# Patient Record
Sex: Male | Born: 1980 | Hispanic: Yes | Marital: Single | Smoking: Never smoker
Health system: Southern US, Community
[De-identification: ages and names within clinical notes are randomized; demographics above are authoritative.]

## PROBLEM LIST (undated history)

## (undated) DIAGNOSIS — Z87442 Personal history of urinary calculi: Secondary | ICD-10-CM

## (undated) DIAGNOSIS — K429 Umbilical hernia without obstruction or gangrene: Secondary | ICD-10-CM

## (undated) HISTORY — PX: NO PAST SURGERIES: SHX2092

---

## 2007-02-20 ENCOUNTER — Ambulatory Visit: Payer: Self-pay | Admitting: Urology

## 2012-07-09 ENCOUNTER — Emergency Department: Payer: Self-pay | Admitting: Emergency Medicine

## 2012-07-09 LAB — CBC WITH DIFFERENTIAL/PLATELET
Basophil #: 0.1 10*3/uL (ref 0.0–0.1)
Basophil %: 0.9 %
Eosinophil #: 0.1 10*3/uL (ref 0.0–0.7)
Eosinophil %: 0.6 %
HGB: 17.4 g/dL (ref 13.0–18.0)
Lymphocyte %: 22.9 %
MCHC: 34.9 g/dL (ref 32.0–36.0)
Monocyte %: 7.6 %
Neutrophil #: 7 10*3/uL — ABNORMAL HIGH (ref 1.4–6.5)
Neutrophil %: 68 %
RBC: 5.55 10*6/uL (ref 4.40–5.90)
RDW: 12.6 % (ref 11.5–14.5)
WBC: 10.3 10*3/uL (ref 3.8–10.6)

## 2012-07-09 LAB — COMPREHENSIVE METABOLIC PANEL
Albumin: 4.4 g/dL (ref 3.4–5.0)
Alkaline Phosphatase: 104 U/L (ref 50–136)
Bilirubin,Total: 1.6 mg/dL — ABNORMAL HIGH (ref 0.2–1.0)
Chloride: 103 mmol/L (ref 98–107)
Creatinine: 0.87 mg/dL (ref 0.60–1.30)
EGFR (African American): >60
Osmolality: 277 (ref 275–301)
SGPT (ALT): 34 U/L (ref 12–78)
Sodium: 138 mmol/L (ref 136–145)
Total Protein: 8.1 g/dL (ref 6.4–8.2)

## 2012-07-09 LAB — TROPONIN I: Troponin-I: <0.02 ng/mL

## 2012-07-09 LAB — TSH: Thyroid Stimulating Horm: 2.96 u[IU]/mL

## 2012-07-12 ENCOUNTER — Emergency Department: Payer: Self-pay | Admitting: *Deleted

## 2012-07-12 LAB — CBC
MCH: 30.8 pg (ref 26.0–34.0)
MCHC: 35.1 g/dL (ref 32.0–36.0)
MCV: 88 fL (ref 80–100)
Platelet: 402 10*3/uL (ref 150–440)
RBC: 5.56 10*6/uL (ref 4.40–5.90)
RDW: 12.5 % (ref 11.5–14.5)
WBC: 11.4 10*3/uL — ABNORMAL HIGH (ref 3.8–10.6)

## 2012-07-12 LAB — COMPREHENSIVE METABOLIC PANEL
Anion Gap: 12 (ref 7–16)
BUN: 18 mg/dL (ref 7–18)
Bilirubin,Total: 2.8 mg/dL — ABNORMAL HIGH (ref 0.2–1.0)
Calcium, Total: 9.1 mg/dL (ref 8.5–10.1)
Chloride: 96 mmol/L — ABNORMAL LOW (ref 98–107)
Creatinine: 0.98 mg/dL (ref 0.60–1.30)
EGFR (African American): >60
Osmolality: 267 (ref 275–301)
Potassium: 3.4 mmol/L — ABNORMAL LOW (ref 3.5–5.1)
Total Protein: 8 g/dL (ref 6.4–8.2)

## 2012-07-14 ENCOUNTER — Emergency Department: Payer: Self-pay | Admitting: Emergency Medicine

## 2012-07-14 LAB — COMPREHENSIVE METABOLIC PANEL
Albumin: 4.1 g/dL (ref 3.4–5.0)
Alkaline Phosphatase: 93 U/L (ref 50–136)
BUN: 18 mg/dL (ref 7–18)
Bilirubin,Total: 2.9 mg/dL — ABNORMAL HIGH (ref 0.2–1.0)
Calcium, Total: 8.7 mg/dL (ref 8.5–10.1)
Creatinine: 1.04 mg/dL (ref 0.60–1.30)
EGFR (Non-African Amer.): >60
Glucose: 97 mg/dL (ref 65–99)
Potassium: 3.7 mmol/L (ref 3.5–5.1)
SGOT(AST): 23 U/L (ref 15–37)
SGPT (ALT): 29 U/L (ref 12–78)
Total Protein: 7.7 g/dL (ref 6.4–8.2)

## 2012-07-14 LAB — CBC WITH DIFFERENTIAL/PLATELET
Basophil %: 0.5 %
Eosinophil #: 0 10*3/uL (ref 0.0–0.7)
Eosinophil %: 0.3 %
HGB: 16.7 g/dL (ref 13.0–18.0)
Lymphocyte %: 19.7 %
MCH: 30.7 pg (ref 26.0–34.0)
MCHC: 35 g/dL (ref 32.0–36.0)
Monocyte #: 0.7 x10 3/mm (ref 0.2–1.0)
Neutrophil #: 7.6 10*3/uL — ABNORMAL HIGH (ref 1.4–6.5)
Platelet: 397 10*3/uL (ref 150–440)
WBC: 10.5 10*3/uL (ref 3.8–10.6)

## 2012-07-14 LAB — LIPASE, BLOOD: Lipase: 265 U/L (ref 73–393)

## 2012-09-13 ENCOUNTER — Emergency Department: Payer: Self-pay | Admitting: Emergency Medicine

## 2012-09-13 LAB — CBC
HGB: 16.9 g/dL (ref 13.0–18.0)
Platelet: 457 10*3/uL — ABNORMAL HIGH (ref 150–440)
RBC: 5.48 10*6/uL (ref 4.40–5.90)
WBC: 10.8 10*3/uL — ABNORMAL HIGH (ref 3.8–10.6)

## 2012-09-13 LAB — COMPREHENSIVE METABOLIC PANEL
Albumin: 4.3 g/dL (ref 3.4–5.0)
Alkaline Phosphatase: 97 U/L (ref 50–136)
Calcium, Total: 8.4 mg/dL — ABNORMAL LOW (ref 8.5–10.1)
Co2: 25 mmol/L (ref 21–32)
EGFR (Non-African Amer.): >60
Osmolality: 260 (ref 275–301)
SGOT(AST): 21 U/L (ref 15–37)
SGPT (ALT): 35 U/L (ref 12–78)

## 2012-09-13 LAB — URINALYSIS, COMPLETE
Bacteria: NONE SEEN
Bilirubin,UR: NEGATIVE
Ketone: NEGATIVE
Leukocyte Esterase: NEGATIVE
RBC,UR: 1 /HPF (ref 0–5)
Specific Gravity: 1.012 (ref 1.003–1.030)
Squamous Epithelial: NONE SEEN
WBC UR: 2 /HPF (ref 0–5)

## 2012-09-14 LAB — RAPID INFLUENZA A&B ANTIGENS

## 2013-09-28 IMAGING — CR DG CHEST 2V
1 series · 2 of 2 positions shown · non-contrast
Comparison: none

REASON FOR EXAM: epigastric pain
COMMENTS:

PROCEDURE:     DXR - DXR CHEST PA (OR AP) AND LATERAL  - July 12, 2012  [DATE]
RESULT:     The lungs are clear. The heart and pulmonary vessels are normal.
The bony and mediastinal structures are unremarkable. There is no effusion.
There is no pneumothorax or evidence of congestive failure.

[Series 1: pa · 0.17mm/px · 2 of 2 slices shown]
[im 1/2]
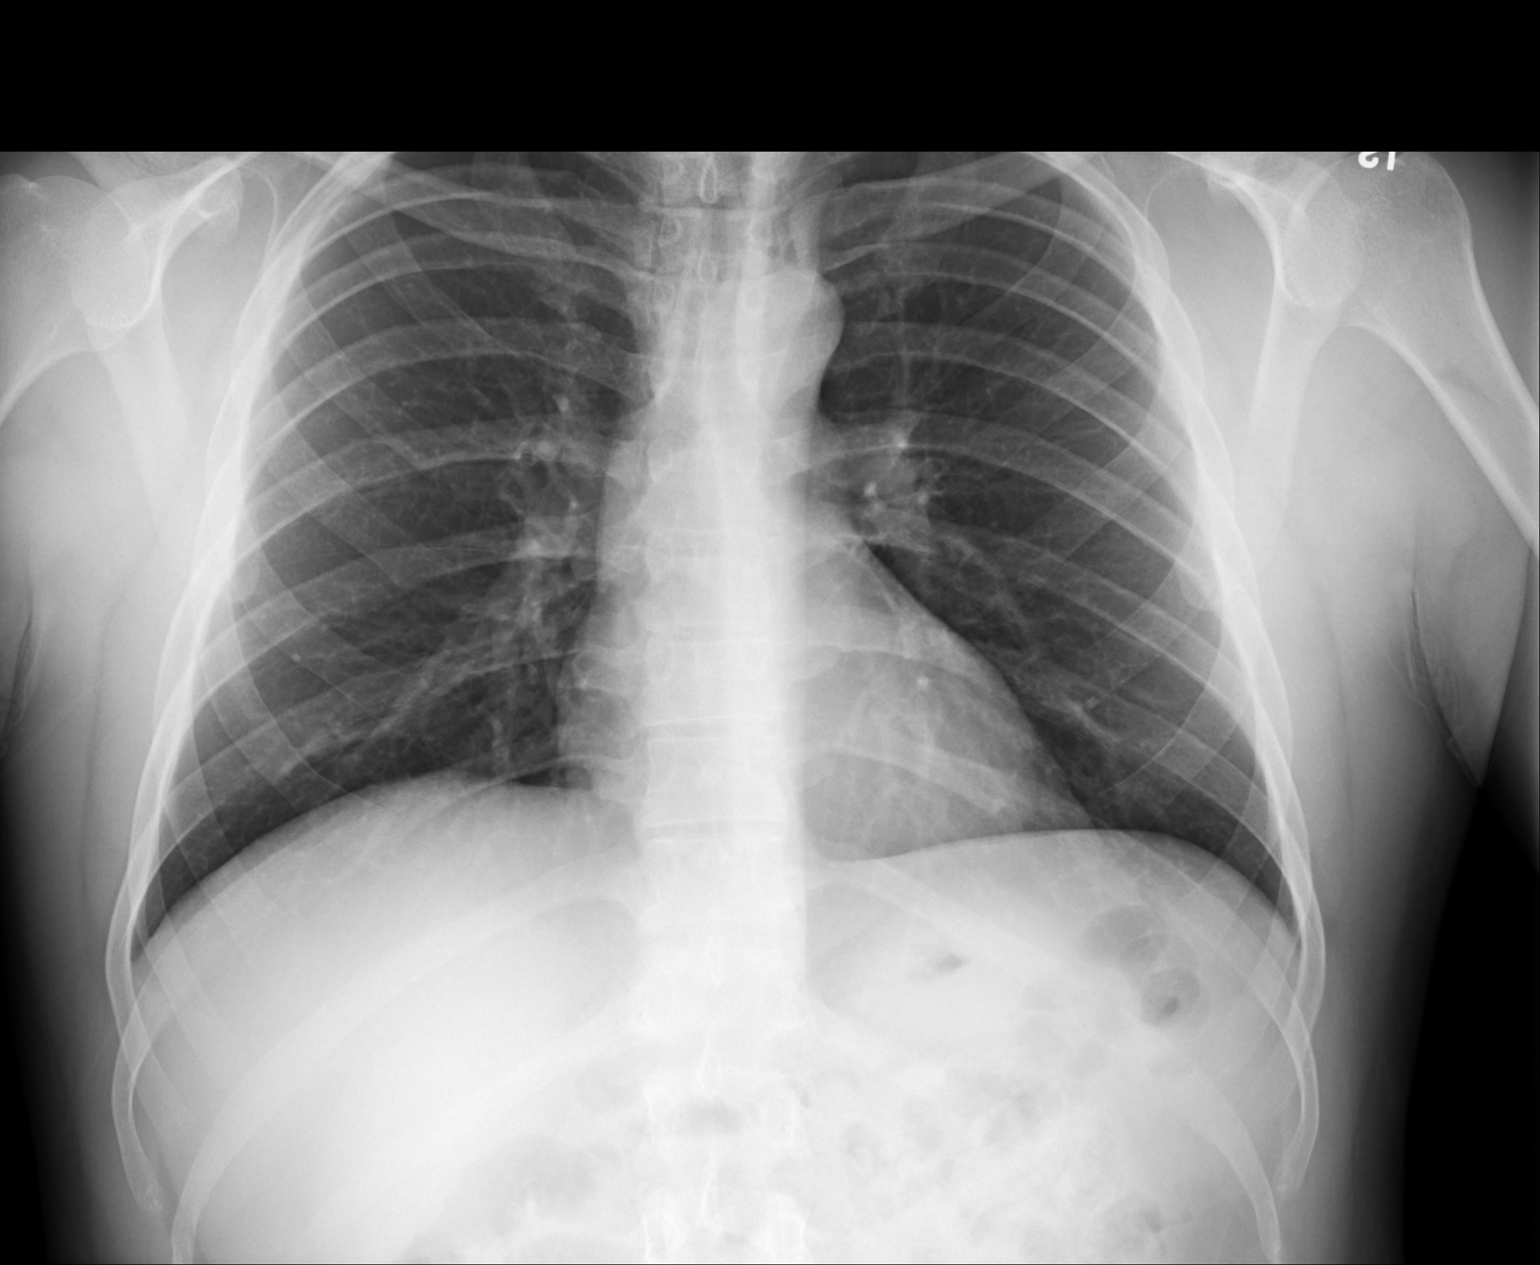
[im 2/2]
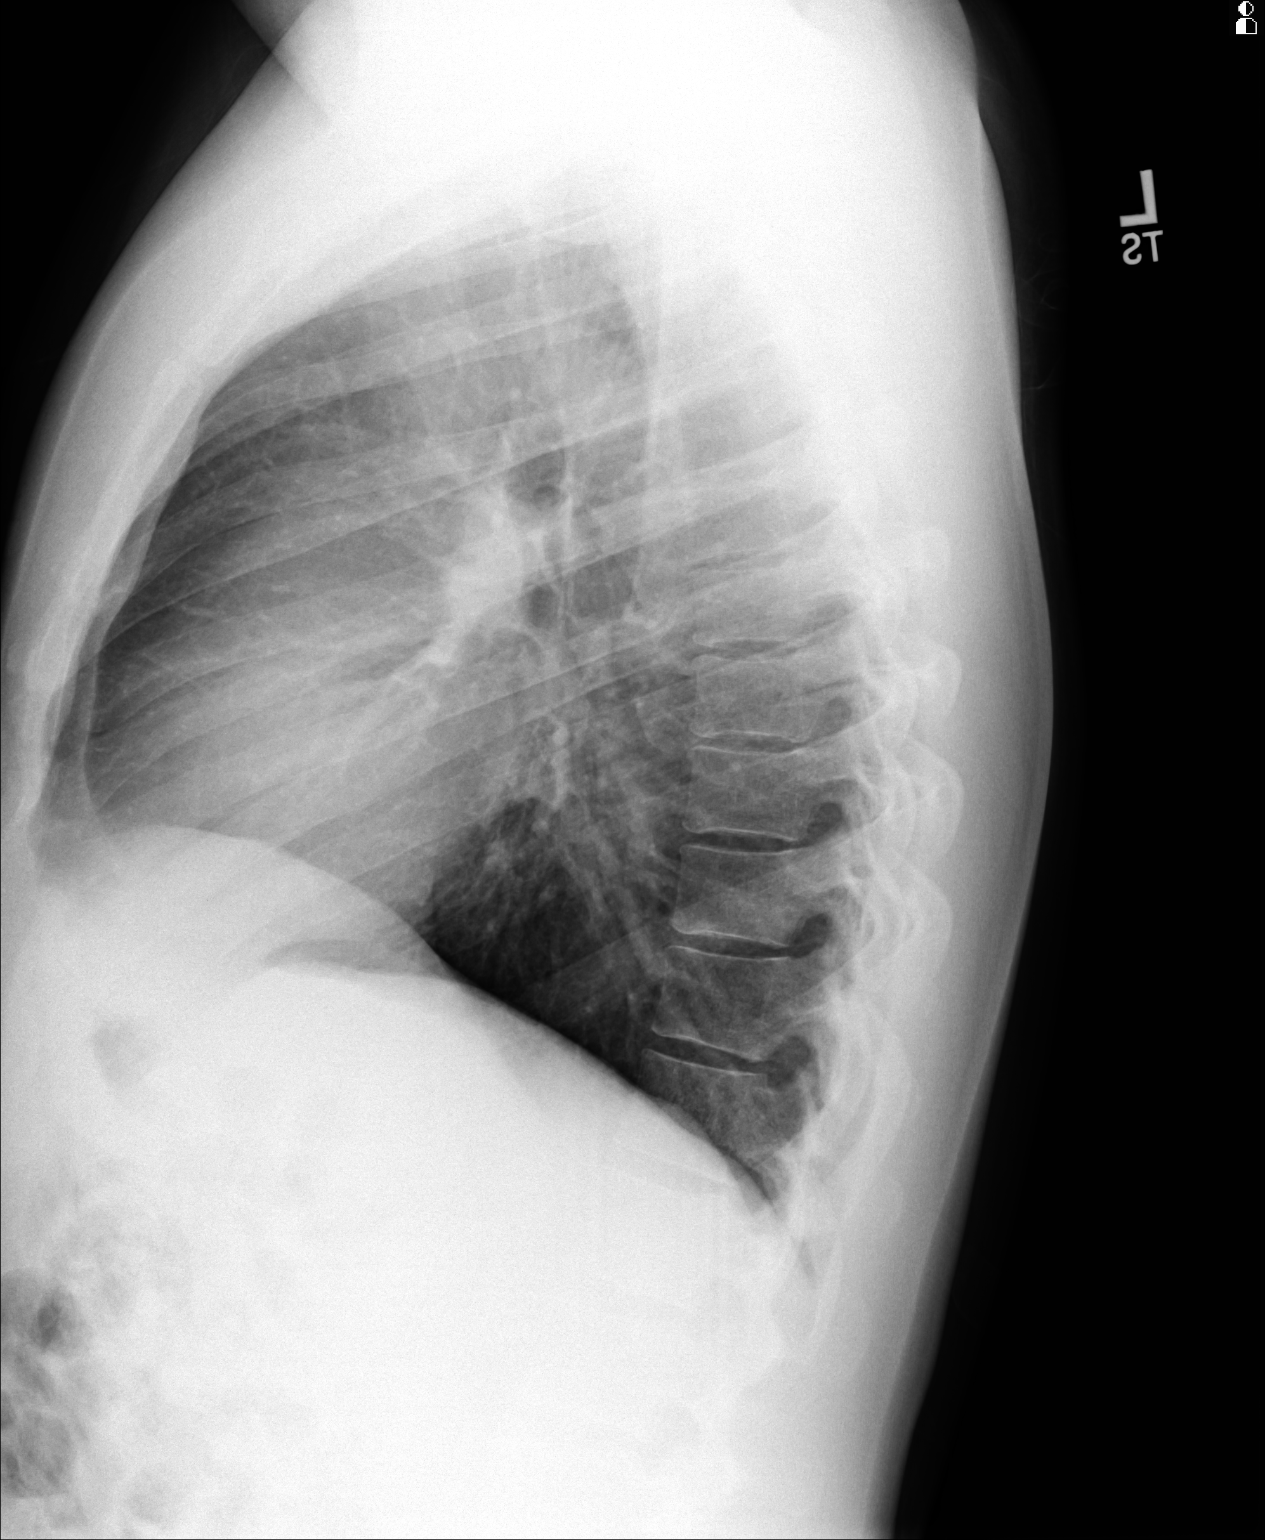

[2 of 2 positions shown; findings below may reference images not displayed]

IMPRESSION: No acute cardiopulmonary disease.

[REDACTED]

## 2013-11-30 IMAGING — CT CT HEAD WITHOUT CONTRAST
2 series · 16 of 30 positions shown, 20 images · non-contrast
Comparison: none

REASON FOR EXAM: weakness, dizznies, body aches, h/a.
COMMENTS:

PROCEDURE:     CT  - CT HEAD WITHOUT CONTRAST  - September 13, 2012  [DATE]
RESULT:     Comparison:  None
TECHNIQUE: Multiple axial images from the foramen magnum to the vertex were
obtained without IV contrast.

[Series 2: without · axial · non-contrast · 0.41mm/px · z∈[-143,-13]mm · 13 of 32 slices shown, 17 images]
[im 3/32  brain]
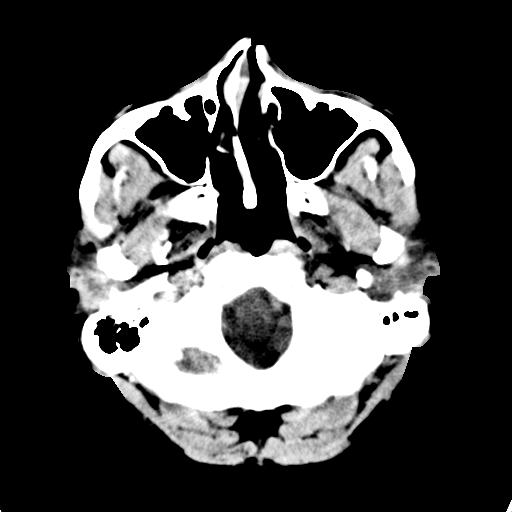
[im 3/32  bone]
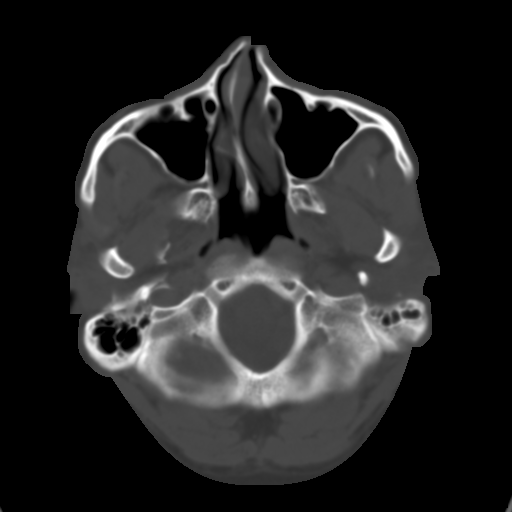
[im 5/32  brain]
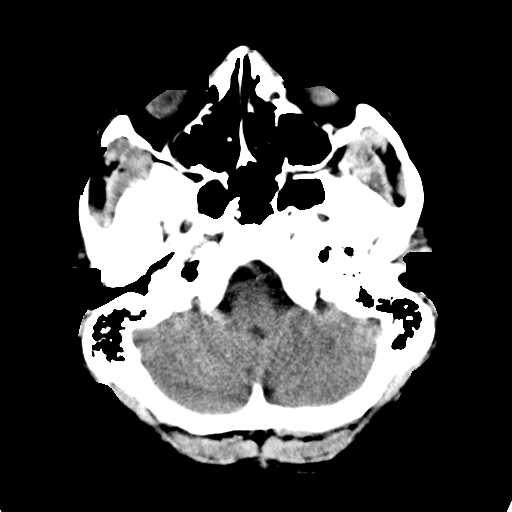
[im 7/32  brain]
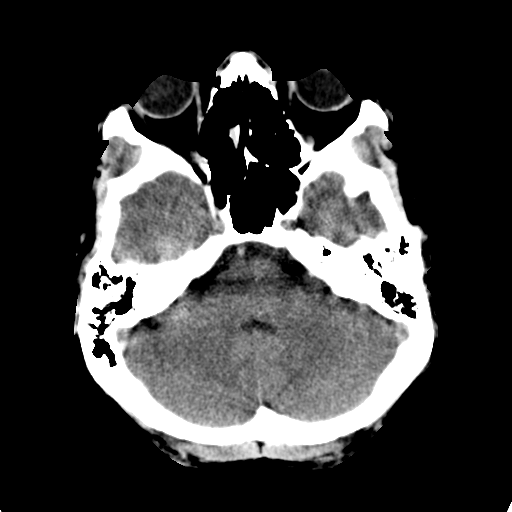
[im 9/32  brain]
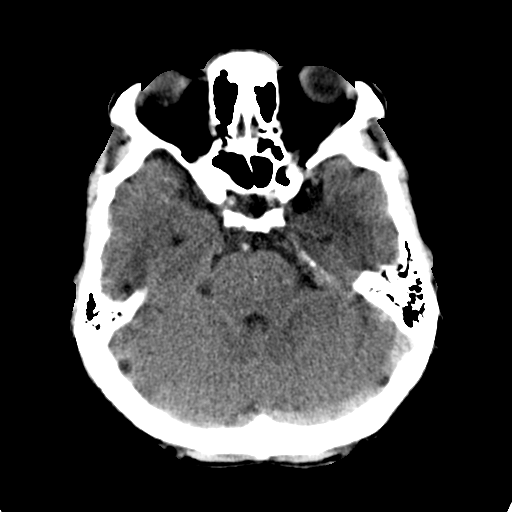
[im 12/32  brain]
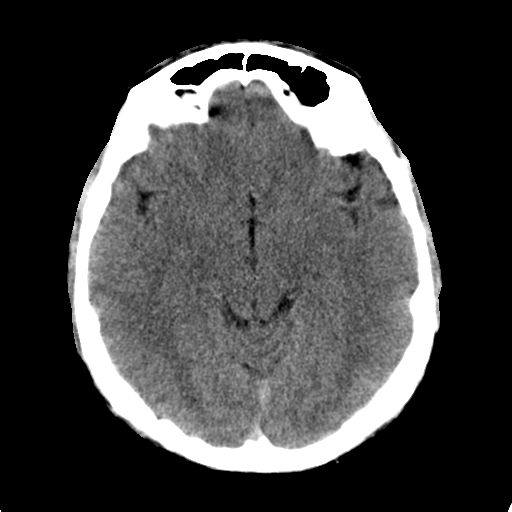
[im 12/32  bone]
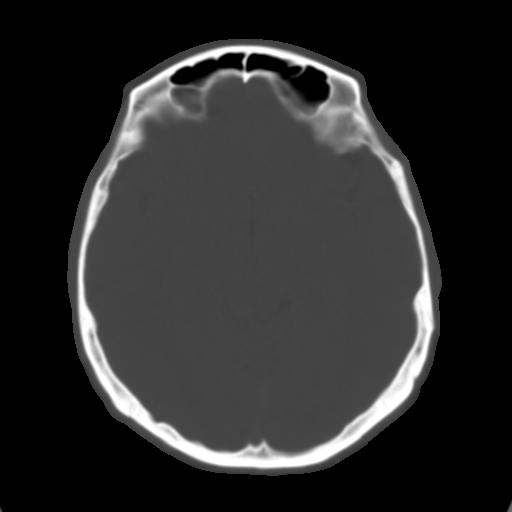
[im 14/32  brain]
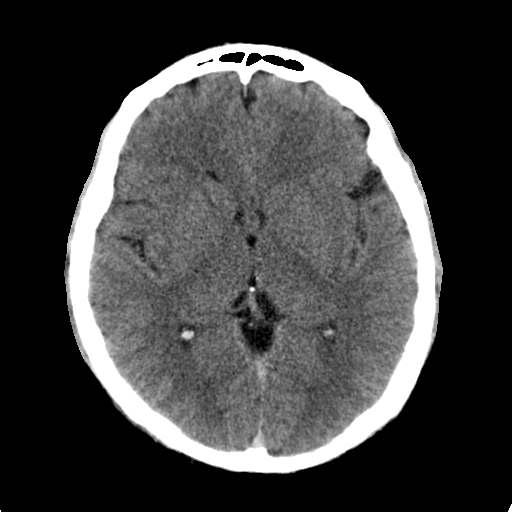
[im 16/32  brain]
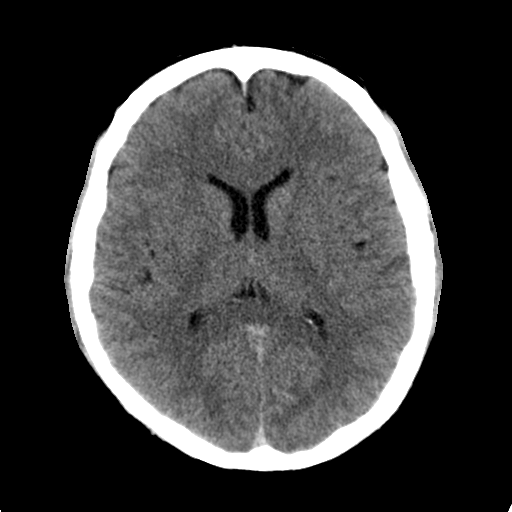
[im 18/32  brain]
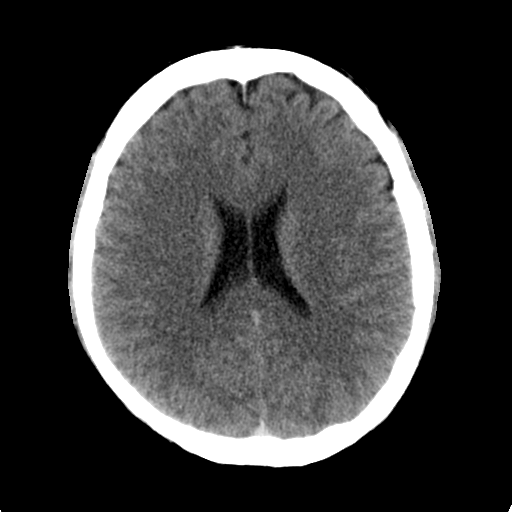
[im 20/32  brain]
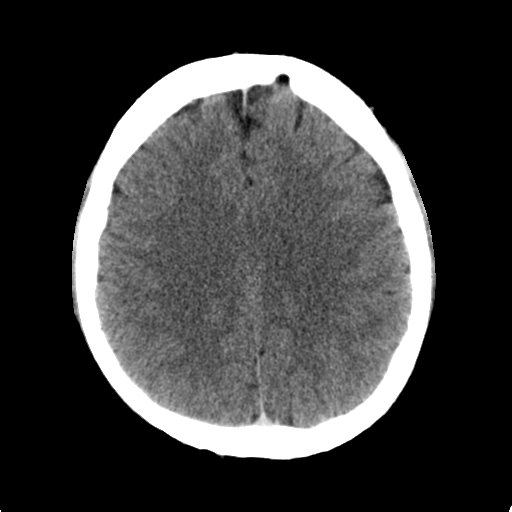
[im 20/32  bone]
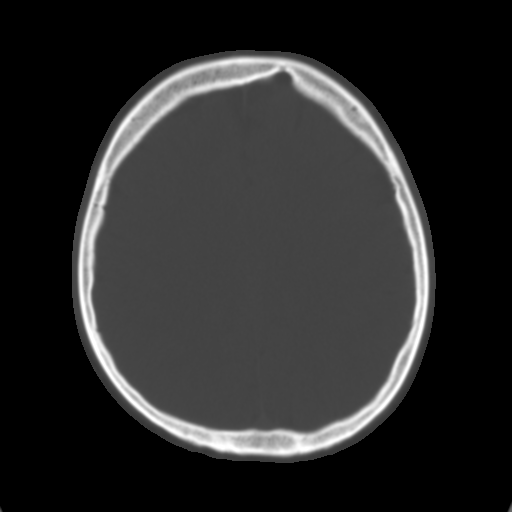
[im 23/32  brain]
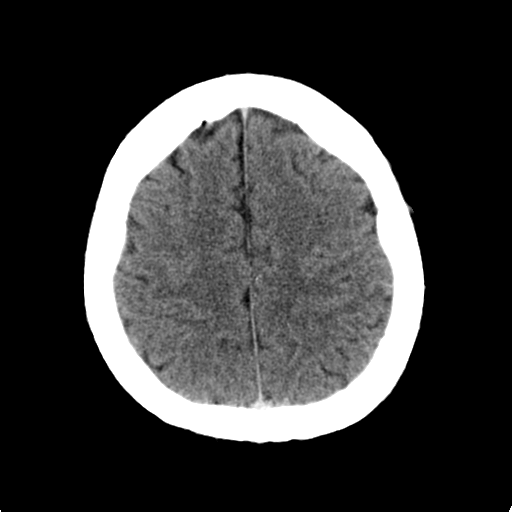
[im 25/32  brain]
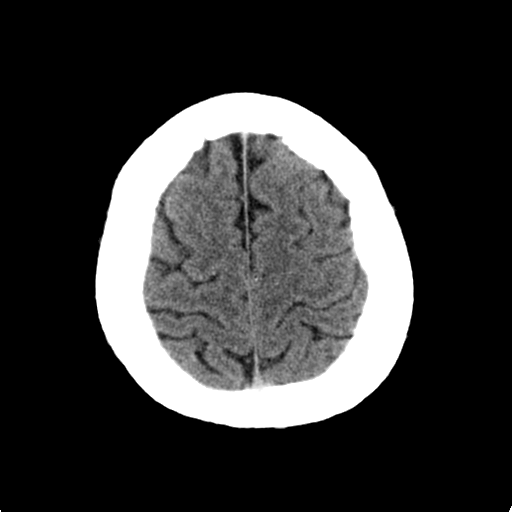
[im 27/32  brain]
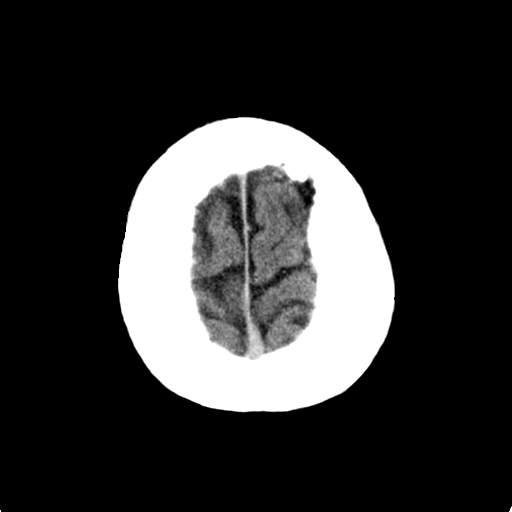
[im 29/32  brain]
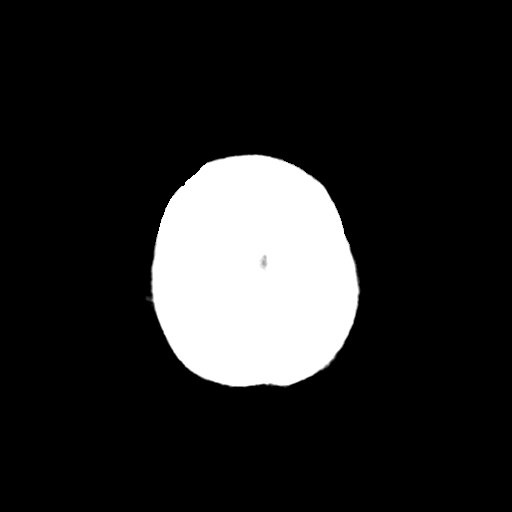
[im 29/32  bone]
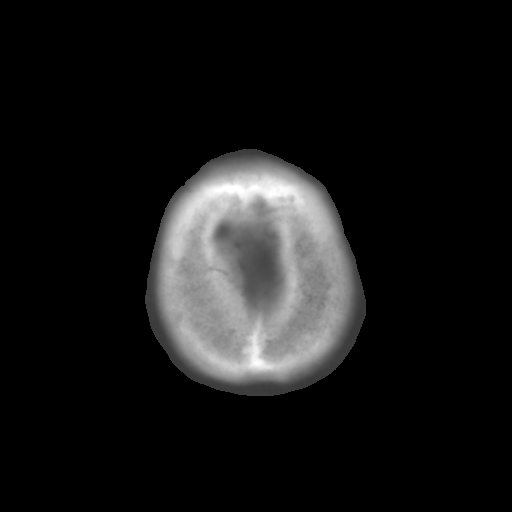

[Series 3: bone · axial · 0.41mm/px · z∈[-143,-98]mm · 3 of 32 slices shown]
[im 3/32  bone]
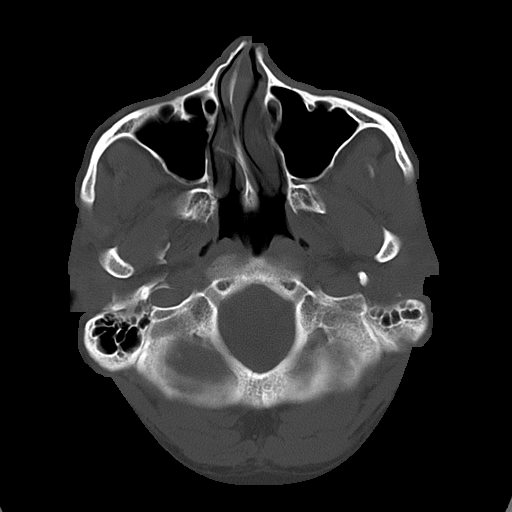
[im 7/32  bone]
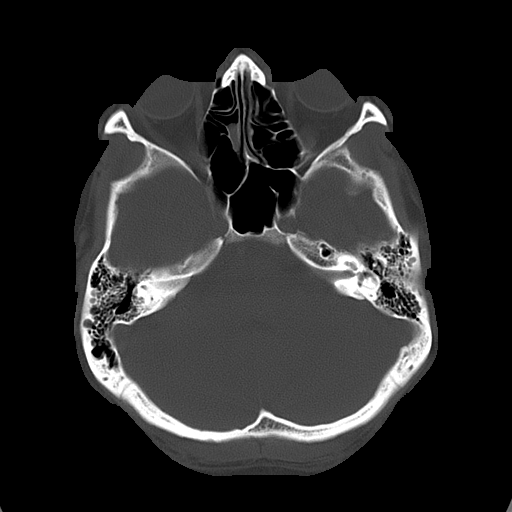
[im 12/32  bone]
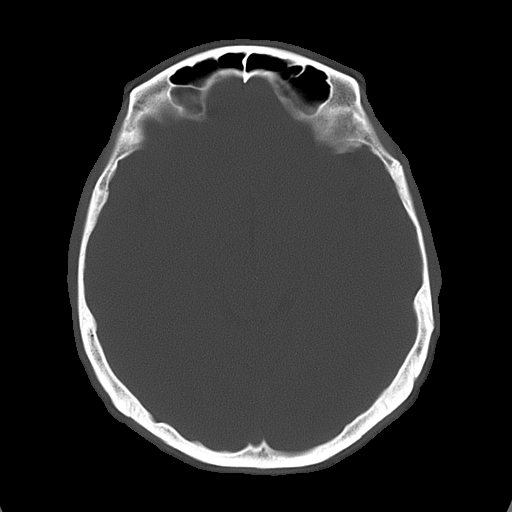

[16 of 30 positions shown; findings below may reference images not displayed]

FINDINGS: There is no evidence for mass effect, midline shift, or extra-axial fluid
collections. There is no evidence for space-occupying lesion, intracranial
hemorrhage, or cortical-based area of infarction.

The osseous structures are unremarkable.
IMPRESSION: No acute intracranial process.

[REDACTED]

## 2018-02-03 DIAGNOSIS — K429 Umbilical hernia without obstruction or gangrene: Secondary | ICD-10-CM

## 2018-02-03 HISTORY — DX: Umbilical hernia without obstruction or gangrene: K42.9

## 2018-02-08 ENCOUNTER — Encounter: Payer: Self-pay | Admitting: Emergency Medicine

## 2018-02-08 ENCOUNTER — Emergency Department
Admission: EM | Admit: 2018-02-08 | Discharge: 2018-02-09 | Disposition: A | Discharge status: Home / Self Care | Payer: Self-pay | Attending: Emergency Medicine | Admitting: Emergency Medicine

## 2018-02-08 ENCOUNTER — Other Ambulatory Visit: Payer: Self-pay

## 2018-02-08 DIAGNOSIS — R05 Cough: Secondary | ICD-10-CM | POA: Insufficient documentation

## 2018-02-08 DIAGNOSIS — K429 Umbilical hernia without obstruction or gangrene: Secondary | ICD-10-CM | POA: Insufficient documentation

## 2018-02-08 LAB — URINALYSIS, COMPLETE (UACMP) WITH MICROSCOPIC
BACTERIA UA: NONE SEEN
Bilirubin Urine: NEGATIVE
GLUCOSE, UA: NEGATIVE mg/dL
KETONES UR: NEGATIVE mg/dL
LEUKOCYTES UA: NEGATIVE
Nitrite: NEGATIVE
PROTEIN: NEGATIVE mg/dL
Specific Gravity, Urine: 1.015 (ref 1.005–1.030)
Squamous Epithelial / LPF: NONE SEEN
pH: 6 (ref 5.0–8.0)

## 2018-02-08 LAB — LIPASE, BLOOD: LIPASE: 42 U/L (ref 11–51)

## 2018-02-08 LAB — COMPREHENSIVE METABOLIC PANEL
ALT: 70 U/L — ABNORMAL HIGH (ref 17–63)
AST: 44 U/L — ABNORMAL HIGH (ref 15–41)
Albumin: 4.5 g/dL (ref 3.5–5.0)
Alkaline Phosphatase: 95 U/L (ref 38–126)
Anion gap: 10 (ref 5–15)
BUN: 20 mg/dL (ref 6–20)
CO2: 25 mmol/L (ref 22–32)
Calcium: 9.4 mg/dL (ref 8.9–10.3)
Chloride: 101 mmol/L (ref 101–111)
Creatinine, Ser: 0.96 mg/dL (ref 0.61–1.24)
Glucose, Bld: 103 mg/dL — ABNORMAL HIGH (ref 65–99)
POTASSIUM: 3.8 mmol/L (ref 3.5–5.1)
Sodium: 136 mmol/L (ref 135–145)
Total Bilirubin: 1.5 mg/dL — ABNORMAL HIGH (ref 0.3–1.2)
Total Protein: 8.5 g/dL — ABNORMAL HIGH (ref 6.5–8.1)

## 2018-02-08 LAB — CBC
HEMATOCRIT: 49.3 % (ref 40.0–52.0)
Hemoglobin: 16.7 g/dL (ref 13.0–18.0)
MCH: 30.6 pg (ref 26.0–34.0)
MCHC: 33.9 g/dL (ref 32.0–36.0)
MCV: 90.3 fL (ref 80.0–100.0)
PLATELETS: 417 10*3/uL (ref 150–440)
RBC: 5.46 MIL/uL (ref 4.40–5.90)
RDW: 13.1 % (ref 11.5–14.5)
WBC: 10.1 10*3/uL (ref 3.8–10.6)

## 2018-02-08 NOTE — ED Triage Notes (Signed)
Patient ambulatory to triage with steady gait, without difficulty or distress noted; [facility], pt here with pain to umbilical region x 3 days with no accomp symptoms, appears to have hernia; denies hx of same

## 2018-02-08 NOTE — ED Notes (Signed)
EDT Mardene CelesteJoanna to triage to complete protocols

## 2018-02-09 NOTE — ED Notes (Signed)
[employer] interpreter used to review discharge and follow up instructions.

## 2018-02-09 NOTE — ED Provider Notes (Signed)
[facility] Emergency Department Provider Note _   First MD Initiated Contact with Patient 02/09/18 0013     (approximate)  I have reviewed the triage vital signs and the nursing notes.   HISTORY  Chief Complaint Abdominal Pain    HPI Stanley Gross is a 37 y.o. male of umbilical hernia times 3 years presents to the emergency department with intermittent pain at the hernia site however patient states of the past 3 days pain has been more persistent.  Patient denies any constipation.  Patient denies any nausea or vomiting.  Patient states his current pain score is 5 out of 10.  Patient states that he recently had a cold which resulted in him coughing a lot and during 1 of these coughing spells he started to experience discomfort at his umbilicus.  Patient denies any fever   Past medical history Umbilical hernia There are no active problems to display for this patient.   Past surgical history None  Prior to Admission medications   Not on File    Allergies No known drug allergies No family history on file.  Social History Social History   Tobacco Use  . Smoking status: Never Smoker  . Smokeless tobacco: Never Used  Substance Use Topics  . Alcohol use: Not on file  . Drug use: Not on file    Review of Systems Constitutional: No fever/chills Eyes: No visual changes. ENT: No sore throat. Cardiovascular: Denies chest pain. Respiratory: Denies shortness of breath. Gastrointestinal: No abdominal pain.  No nausea, no vomiting.  No diarrhea.  No constipation. Genitourinary: Negative for dysuria. Musculoskeletal: Negative for neck pain.  Negative for back pain. Integumentary: Negative for rash. Neurological: Negative for headaches, focal weakness or numbness.   ____________________________________________   PHYSICAL EXAM:  VITAL SIGNS: ED Triage Vitals  Enc Vitals Group     BP 02/08/18 2311 (!) 157/102     Pulse Rate 02/08/18  2311 95     Resp 02/08/18 2311 20     Temp 02/08/18 2311 99.1 F (37.3 C)     Temp Source 02/08/18 2311 Oral     SpO2 02/08/18 2311 99 %     Weight 02/08/18 2312 74.8 kg (165 lb)     Height 02/08/18 2312 1.65 m (5' 4.96")     Head Circumference --      Peak Flow --      Pain Score 02/08/18 2311 9     Pain Loc --      Pain Edu? --      Excl. in GC? --     Constitutional: Alert and oriented. Well appearing and in no acute distress. Eyes: Conjunctivae are normal.  Head: Atraumatic. Mouth/Throat: Mucous membranes are moist.  Oropharynx non-erythematous. Neck: No stridor.   Cardiovascular: Normal rate, regular rhythm. Good peripheral circulation. Grossly normal heart sounds. Respiratory: Normal respiratory effort.  No retractions. Lungs CTAB. Gastrointestinal: Soft and nontender.  Palpable umbilical hernia which is easily reducible no distention.  Musculoskeletal: No lower extremity tenderness nor edema. No gross deformities of extremities. Neurologic:  Normal speech and language. No gross focal neurologic deficits are appreciated.  Skin:  Skin is warm, dry and intact. No rash noted.   ____________________________________________   LABS (all labs ordered are listed, but only abnormal results are displayed)  Labs Reviewed  COMPREHENSIVE METABOLIC PANEL - Abnormal; Notable for the following components:      Result Value   Glucose, Bld 103 (*)  Total Protein 8.5 (*)    AST 44 (*)    ALT 70 (*)    Total Bilirubin 1.5 (*)    All other components within normal limits  URINALYSIS, COMPLETE (UACMP) WITH MICROSCOPIC - Abnormal; Notable for the following components:   Color, Urine YELLOW (*)    APPearance CLEAR (*)    Hgb urine dipstick SMALL (*)    All other components within normal limits  LIPASE, BLOOD  CBC   ____________________________________________    Procedures   ____________________________________________   INITIAL IMPRESSION / ASSESSMENT AND PLAN / ED  COURSE  As part of my medical decision making, I reviewed the following data within the electronic MEDICAL RECORD NUMBER   37 year old male presenting with above-stated history and physical exam secondary to an umbilical hernia.  Hernia was reduced without any difficulty patient's pain completely resolved following reduction abdomen is soft and nontender on repeat evaluation.  Patient referred to Dr. Aleen CampiPiscoya general surgeon for further outpatient evaluation and management ____________________________________________  FINAL CLINICAL IMPRESSION(S) / ED DIAGNOSES  Final diagnoses:  Umbilical hernia without obstruction and without gangrene     MEDICATIONS GIVEN DURING THIS VISIT:  Medications - No data to display   ED Discharge Orders    None       Note:  This document was prepared using Dragon voice recognition software and may include unintentional dictation errors.    Darci CurrentBrown, [place] N, MD 02/09/18 [phone]

## 2018-02-13 ENCOUNTER — Encounter: Payer: Self-pay | Admitting: Surgery

## 2018-02-13 ENCOUNTER — Ambulatory Visit: Payer: Self-pay | Admitting: Surgery

## 2018-02-13 VITALS — BP 136/101 | HR 115 | Temp 98.1°F | Ht 65.0 in | Wt 173.0 lb

## 2018-02-13 DIAGNOSIS — K429 Umbilical hernia without obstruction or gangrene: Secondary | ICD-10-CM

## 2018-02-13 NOTE — Patient Instructions (Signed)
Reparacin abierta de una hernia en los adultos, cuidados posteriores  Open Hernia Repair, Adult, Care After  Estas indicaciones le proporcionan informacin acerca de cmo deber cuidarse despus del procedimiento. El mdico tambin podr darle indicaciones ms especficas. Si tiene problemas o preguntas, llame al mdico.  Siga estas indicaciones en su casa:  Cuidado del corte quirrgico (incisin)     Siga las indicaciones del mdico en lo que respecta al cuidado de la zona del corte quirrgico. Haga lo siguiente:  ? Lvese las manos con agua y jabn antes de cambiar las vendas (vendaje). Use un desinfectante para manos si no dispone de agua y jabn.  ? Cambie el vendaje como se lo haya indicado el mdico.  ? No retire los puntos (suturas), la goma para cerrar la piel o las tiras adhesivas. Tal vez deban dejarse puestos en la piel durante 2semanas o ms tiempo. Si las tiras adhesivas se despegan y se enroscan, puede recortar los bordes sueltos. No retire las tiras adhesivas por completo a menos que el mdico lo autorice.   Controle el corte quirrgico todos los das para detectar signos de infeccin. Est atento a los siguientes signos:  ? Aumento del enrojecimiento, de la hinchazn o del dolor.  ? Ms lquido o sangre.  ? Calor.  ? Pus o mal olor.  Actividad   No conduzca ni use maquinaria pesada mientras toma analgsicos recetados. No conduzca hasta que el mdico lo autorice.   Hasta que su mdico lo autorice:  ? No levante ningn objeto que pese ms de 10libras (4,5kg).  ? No practique deportes de contacto.   Retome sus actividades habituales como se lo haya indicado el mdico. Pregntele al mdico qu actividades son seguras para usted.  Instrucciones generales   A fin de prevenir o tratar el estreimiento mientras toma analgsicos recetados, el mdico puede recomendarle lo siguiente:  ? Beber suficiente lquido para mantener el pis (orina) claro o de color amarillo plido.  ? Tomar medicamentos  recetados o de venta libre.  ? Consumir alimentos ricos en fibra, como frutas y verduras frescas, cereales integrales y frijoles.  ? Limitar el consumo de alimentos con alto contenido de grasas y azcares procesados, como alimentos fritos o dulces.   Tome los medicamentos de venta libre y los recetados solamente como se lo haya indicado el mdico.   No tome baos de inmersin, no practique natacin ni use el jacuzzi hasta que el mdico lo autorice.   Concurra a todas las visitas de control como se lo haya indicado el mdico. Esto es importante.  Comunquese con un mdico si:   Le aparece una erupcin cutnea.   Tiene ms enrojecimiento, hinchazn o dolor alrededor del corte quirrgico.   Aumenta la cantidad de lquido o sangre que sale del corte.   El corte est caliente al tacto.   Tiene pus o percibe mal olor que emana del corte quirrgico.   Tiene fiebre o siente escalofros.   Observa sangre en las heces (materia fecal).   No ha defecado en 2 o 3das.   Los medicamentos no le alivian el dolor.  Solicite ayuda de inmediato si:   Siente dolor en el pecho o le falta el aire.   Tiene sensacin de desvanecimiento.   Se siente dbil y mareado (sensacin de desvanecimiento).   Siente mucho dolor.   Vomita y el dolor empeora.  Esta informacin no tiene como fin reemplazar el consejo del mdico. Asegrese de hacerle al mdico cualquier pregunta que   tenga.  Document Released: 12/13/2014 Document Revised: 02/25/2017 Document Reviewed: 05/05/2016  Elsevier Interactive Patient Education  2018 Elsevier Inc.

## 2018-02-13 NOTE — Progress Notes (Signed)
Stanley Gross is an 37 y.o. male.   Chief Complaint: UH  Consult requested by emergency room physician.  HPI: This a patient who is in the emergency room.  He had intermittent pain over the last few days prior to being seen in the ED.  He is known about his hernia for 3 years or so more.  No past medical history on file.  No past medical history  No past surgical history on file.  No past surgical history No family history on file. Social History:  reports that  has never smoked. he has never used smokeless tobacco. His alcohol and drug histories are not on file.  Allergies: No Known Allergies   (Not in a hospital admission)   Review of Systems:   Review of Systems  Constitutional: Negative.   HENT: Negative.   Eyes: Negative.   Respiratory: Negative.   Cardiovascular: Negative.   Gastrointestinal: Positive for abdominal pain. Negative for nausea and vomiting.  Genitourinary: Negative.   Musculoskeletal: Negative.   Skin: Negative.   Neurological: Negative.   Endo/Heme/Allergies: Negative.   Psychiatric/Behavioral: Negative.     Physical Exam:  Physical Exam  Constitutional: He is oriented to person, place, and time and well-developed, well-nourished, and in no distress.  HENT:  Head: Normocephalic and atraumatic.  Eyes: Pupils are equal, round, and reactive to light. Left eye exhibits no discharge. No scleral icterus.  Neck: Normal range of motion. No JVD present.  Cardiovascular: Normal rate, regular rhythm and normal heart sounds.  Pulmonary/Chest: Effort normal and breath sounds normal. No respiratory distress. He has no wheezes. He has no rales.  Abdominal: Soft. He exhibits no distension. There is no tenderness. There is no rebound and no guarding.  Umbilical hernia reducible  Musculoskeletal: Normal range of motion. He exhibits no edema or tenderness.  Lymphadenopathy:    He has no cervical adenopathy.  Neurological: He is alert and oriented to  person, place, and time.  Skin: Skin is warm and dry. No rash noted. No erythema.  Psychiatric: Mood and affect normal.    There were no vitals taken for this visit.    No results found for this or any previous visit (from the past 48 hour(s)). No results found.   Assessment/Plan This patient with a long-standing umbilical hernia.  It causes a minimal pain.  He does some lifting at work.  I recommended repair with primary suture only as it is quite small.  Rationale for this was discussed via an interpreter.  The risk of bleeding infection and recurrence were all reviewed with him he understood and agreed to proceed questions were answered for him  Stanley Hawichard E [name], MD, FACS

## 2018-02-17 ENCOUNTER — Telehealth: Payer: Self-pay | Admitting: Surgery

## 2018-02-17 NOTE — Telephone Encounter (Signed)
Pt advised of pre op date/time and sx date. Sx: 03/09/18 with Dr Sharene Skeansooper--umbilical hernia repair.  Pre op: 03/02/18 @ 9:00am--office interview.   Patient made aware to call [phone], between 1-3:00pm the day before surgery, to find out what time to arrive.

## 2018-03-02 ENCOUNTER — Other Ambulatory Visit: Payer: Self-pay

## 2018-03-02 ENCOUNTER — Encounter
Admission: RE | Admit: 2018-03-02 | Discharge: 2018-03-02 | Disposition: A | Discharge status: Home / Self Care | Payer: Self-pay | Source: Ambulatory Visit | Attending: Surgery | Admitting: Surgery

## 2018-03-02 DIAGNOSIS — Z01812 Encounter for preprocedural laboratory examination: Secondary | ICD-10-CM | POA: Insufficient documentation

## 2018-03-02 HISTORY — DX: Personal history of urinary calculi: Z87.442

## 2018-03-02 HISTORY — DX: Umbilical hernia without obstruction or gangrene: K42.9

## 2018-03-02 LAB — BASIC METABOLIC PANEL
Anion gap: 11 (ref 5–15)
BUN: 16 mg/dL (ref 6–20)
CHLORIDE: 102 mmol/L (ref 101–111)
CO2: 24 mmol/L (ref 22–32)
CREATININE: 0.98 mg/dL (ref 0.61–1.24)
Calcium: 9.3 mg/dL (ref 8.9–10.3)
GFR calc non Af Amer: >60 mL/min (ref >60)
Glucose, Bld: 97 mg/dL (ref 65–99)
Potassium: 3.7 mmol/L (ref 3.5–5.1)
Sodium: 137 mmol/L (ref 135–145)

## 2018-03-02 LAB — CBC WITH DIFFERENTIAL/PLATELET
Basophils Absolute: 0 10*3/uL (ref 0–0.1)
Basophils Relative: 1 %
EOS ABS: 0.1 10*3/uL (ref 0–0.7)
Eosinophils Relative: 2 %
HEMATOCRIT: 50.5 % (ref 40.0–52.0)
Hemoglobin: 16.9 g/dL (ref 13.0–18.0)
LYMPHS ABS: 1.9 10*3/uL (ref 1.0–3.6)
LYMPHS PCT: 22 %
MCH: 30.1 pg (ref 26.0–34.0)
MCHC: 33.4 g/dL (ref 32.0–36.0)
MCV: 90.1 fL (ref 80.0–100.0)
MONOS PCT: 8 %
Monocytes Absolute: 0.6 10*3/uL (ref 0.2–1.0)
Neutro Abs: 5.7 10*3/uL (ref 1.4–6.5)
Neutrophils Relative %: 67 %
Platelets: 409 10*3/uL (ref 150–440)
RBC: 5.6 MIL/uL (ref 4.40–5.90)
RDW: 12.7 % (ref 11.5–14.5)
WBC: 8.4 10*3/uL (ref 3.8–10.6)

## 2018-03-02 NOTE — Patient Instructions (Signed)
Your procedure is scheduled on: Thursday, April 4TH Su procedimiento est programado para:  Report to THE SECOND FLOOR OF THE MEDICAL MALL Presntese a:  To find out your arrival time please call [phone] between 1PM - 3PM on Wednesday, April 3RD Para saber su hora de llegada por favor llame al [phone] entre la 1PM - 3PM el da:  Remember: Instructions that are not followed completely may result in serious medical risk,  up to and including death, or upon the discretion of your surgeon and anesthesiologist  your surgery may need to be rescheduled.  Recuerde: Las instrucciones que no se siguen completamente Armed forces logistics/support/administrative officerpueden resultar en un riesgo de salud grave,  incluyendo hasta la Justicemuerte o a discrecin de su cirujano y Scientific laboratory techniciananestesilogo, su ciruga se puede posponer.   __X_ 1.Do not eat food after midnight the night before your procedure.                             No gum chewing,lozengers, tic tacs or hard candies.                                     You may drink clear liquids up to 2 hours before you are scheduled to arrive for your surgery-                              DO not drink clear  liquids within 2 hours of the start of your surgery.     Clear Liquids include:    water, apple juice without pulp, clear carbohydrate drink such as Clearfast of Gatorade,                            Black Coffee or Tea (Do not add anything to coffee or tea).      No coma nada despus de la medianoche de la noche anterior a suprocedimiento.                            No coma chicles ni caramelos duros.                                   Puede tomar lquidos claros hasta 2 horas antes de su hora programada de llegada al     hospital para su procedimiento. No tome lquidos claros durante el  transcurso de las 2 horas                        de su llegada programada al hospital para su procedimiento, ya que esto puede llevar a que                         su procedimiento se retrase o tenga que volver a  Magazine features editorprogramarse.  Los lquidos claros incluyen:         - Agua o jugo de [place] sin pulpa         - Bebidas claras con carbohidratos como ClearFast o Gatorade         - Caf negro o t claro (sin leche, sin cremas, no agregue nada al caf ni  al  t)  No tome nada que no est en esta lista.  Los pacientes con diabetes tipo 1 y tipo 2 solo deben Printmaker.  Llame a la clnica de PreCare o a la unidad de Same Day Surgery si   tiene alguna pregunta sobre estas instrucciones.              _X__ 2.Do Not Smoke or use e-cigarettes For 24 Hours Prior to Your     Surgery.  Do not use any chewable tobacco products for at least 6     hours prior to surgery.    No fume ni use cigarrillos electrnicos durante las 24 horas previas    a su [place].  No use ningn producto de tabaco masticable durante                            al menos 6 horas antes de la [place].     __X_ 3. No alcohol for 24 hours before or after surgery.    No tome alcohol durante las 24 horas antes ni despus de la [place].   ____4. Bring all medications with you on the day of surgery if instructed.    Lleve todos los medicamentos con usted el da de su ciruga si se le    ha indicado as.   ____ 5. Notify your doctor if there is any change in your medical condition (cold, fever, infections).    Informe a su mdico si hay algn cambio en su condicin mdica  (resfriado, fiebre, infecciones).   Do not wear jewelry, make-up, hairpins, clips or nail polish.  No use joyas, maquillajes, pinzas/ganchos para el cabello ni esmalte de uas.  Do not wear lotions, powders, or perfumes. You may wear deodorant.  No use lociones, polvos o perfumes.  Puede usar desodorante.    Do not shave 48 hours prior to surgery. Men may shave face and neck.  No se afeite 48 horas antes de la [place].  Los hombres pueden [employer] cara y el cuello.   Do not bring valuables to the hospital.   No lleve objetos de valor al hospital.  [facility] is not  responsible for any belongings or valuables.  [place] no se hace responsable de ningn tipo de pertenencias uobjetos de Licensed conveyancer.               Contacts, dentures or bridgework may not be worn into surgery.  Los lentes de [place], las dentaduras postizas o puentes no se pueden usar en la [place].  Leave your suitcase in the car. After surgery it may be brought to your room.  Deje su maleta en el auto.  Despus de la ciruga podr traerla a su habitacin.  For patients admitted to the hospital, discharge time is determined by your treatment team.  Para los pacientes que sean ingresados al hospital, el tiempo en el cual se le  dar de alta es determinado por su equipo de [place].   Patients discharged the day of surgery will not be allowed to drive home. A los pacientes que se les da de alta el mismo da de la ciruga no se les permitir  conducir a Higher education careers adviser.   Please read over the following fact sheets that you were given: Por favor lea las siguientes hojas de informacin que le dieron:   PREPARING FOR SURGERY   ____ Take these medicines the morning of surgery with A SIP OF  WATER:          [employer] la maana de la ciruga con UN SORBO DE AGUA:  1.NONE  2.   3.   4.       5.  6.  ____ Fleet Enema (as directed)          Enema de Fleet (segn lo indicado)    __X__ Use CHG Soap as directed          Utilice el jabn de CHG segn lo indicado  ____ Stop ALL ASPIRIN PRODUCTS NOW!!          Deje de tomar el Coumadin/Plavix/aspirina el da:  ____ Stop Anti-inflammatories  NOW!!          Deje de tomar antiinflamatorios el da:   ____ Stop supplements until after surgery            Deje de tomar suplementos hasta despus de la ciruga  ____ Bring C-Pap to the hospital          Lleve el C-Pap al hospital   WEAR COMFORTABLE CLOTHES TO THE HOSPITAL.

## 2018-03-02 NOTE — Pre-Procedure Instructions (Signed)
Stanley Gross, interpreter present during entire interview.  Patient has expressed fear for surgery but reinforcements provided.  Informed patient that he must have a driver for day of surgery and someone to stay with him that night.  He wants only his emergency contact [name](Gino) contacted if concerns after surgery.

## 2018-03-08 MED ORDER — CEFAZOLIN SODIUM-DEXTROSE 2-4 GM/100ML-% IV SOLN
2.0000 g | INTRAVENOUS | Status: AC
  Administered 2018-03-09: 2 g via INTRAVENOUS

## 2018-03-09 ENCOUNTER — Ambulatory Visit: Payer: Self-pay | Admitting: Registered Nurse

## 2018-03-09 ENCOUNTER — Encounter
Admission: RE | Disposition: A | Discharge status: Home / Self Care | Payer: Self-pay | Source: Ambulatory Visit | Attending: Surgery

## 2018-03-09 ENCOUNTER — Ambulatory Visit
Admission: RE | Admit: 2018-03-09 | Discharge: 2018-03-09 | Disposition: A | Discharge status: Home / Self Care | Payer: Self-pay | Source: Ambulatory Visit | Attending: Surgery | Admitting: Surgery

## 2018-03-09 DIAGNOSIS — K429 Umbilical hernia without obstruction or gangrene: Secondary | ICD-10-CM

## 2018-03-09 HISTORY — PX: UMBILICAL HERNIA REPAIR: SHX196

## 2018-03-09 SURGERY — REPAIR, HERNIA, UMBILICAL, ADULT
Anesthesia: General | Wound class: Clean

## 2018-03-09 MED ORDER — CHLORHEXIDINE GLUCONATE CLOTH 2 % EX PADS
6.0000 | MEDICATED_PAD | Freq: Once | CUTANEOUS | Status: AC
  Administered 2018-03-09: 6 via TOPICAL

## 2018-03-09 MED ORDER — KETOROLAC TROMETHAMINE 30 MG/ML IJ SOLN
INTRAMUSCULAR | Status: DC | PRN
  Administered 2018-03-09: 30 mg via INTRAVENOUS

## 2018-03-09 MED ORDER — SUGAMMADEX SODIUM 200 MG/2ML IV SOLN
INTRAVENOUS | Status: AC
  Filled 2018-03-09: qty 2

## 2018-03-09 MED ORDER — CEFAZOLIN SODIUM-DEXTROSE 2-4 GM/100ML-% IV SOLN
INTRAVENOUS | Status: AC
  Filled 2018-03-09: qty 100

## 2018-03-09 MED ORDER — PROPOFOL 10 MG/ML IV BOLUS
INTRAVENOUS | Status: DC | PRN
  Administered 2018-03-09: 180 mg via INTRAVENOUS

## 2018-03-09 MED ORDER — DEXAMETHASONE SODIUM PHOSPHATE 10 MG/ML IJ SOLN
INTRAMUSCULAR | Status: DC | PRN
  Administered 2018-03-09: 5 mg via INTRAVENOUS

## 2018-03-09 MED ORDER — HEPARIN SODIUM (PORCINE) 5000 UNIT/ML IJ SOLN
INTRAMUSCULAR | Status: AC
  Administered 2018-03-09: 5000 [IU] via SUBCUTANEOUS
  Filled 2018-03-09: qty 1

## 2018-03-09 MED ORDER — FENTANYL CITRATE (PF) 100 MCG/2ML IJ SOLN
INTRAMUSCULAR | Status: DC | PRN
  Administered 2018-03-09 (×2): 50 ug via INTRAVENOUS

## 2018-03-09 MED ORDER — HEPARIN SODIUM (PORCINE) 5000 UNIT/ML IJ SOLN
5000.0000 [IU] | Freq: Once | INTRAMUSCULAR | Status: AC
  Administered 2018-03-09: 5000 [IU] via SUBCUTANEOUS

## 2018-03-09 MED ORDER — BUPIVACAINE-EPINEPHRINE (PF) 0.25% -1:200000 IJ SOLN
INTRAMUSCULAR | Status: DC | PRN
  Administered 2018-03-09: 30 mL via PERINEURAL

## 2018-03-09 MED ORDER — FAMOTIDINE 20 MG PO TABS
20.0000 mg | ORAL_TABLET | Freq: Once | ORAL | Status: AC
  Administered 2018-03-09: 20 mg via ORAL

## 2018-03-09 MED ORDER — LIDOCAINE HCL (CARDIAC) 20 MG/ML IV SOLN
INTRAVENOUS | Status: DC | PRN
  Administered 2018-03-09: 100 mg via INTRAVENOUS

## 2018-03-09 MED ORDER — LACTATED RINGERS IV SOLN
INTRAVENOUS | Status: DC
  Administered 2018-03-09 (×2): via INTRAVENOUS

## 2018-03-09 MED ORDER — FAMOTIDINE 20 MG PO TABS
ORAL_TABLET | ORAL | Status: AC
  Administered 2018-03-09: 20 mg via ORAL
  Filled 2018-03-09: qty 1

## 2018-03-09 MED ORDER — BUPIVACAINE-EPINEPHRINE (PF) 0.25% -1:200000 IJ SOLN
INTRAMUSCULAR | Status: AC
  Filled 2018-03-09: qty 30

## 2018-03-09 MED ORDER — MIDAZOLAM HCL 2 MG/2ML IJ SOLN
INTRAMUSCULAR | Status: DC | PRN
  Administered 2018-03-09: 2 mg via INTRAVENOUS

## 2018-03-09 MED ORDER — FENTANYL CITRATE (PF) 100 MCG/2ML IJ SOLN
INTRAMUSCULAR | Status: AC
  Filled 2018-03-09: qty 2

## 2018-03-09 MED ORDER — FENTANYL CITRATE (PF) 100 MCG/2ML IJ SOLN: 25.0000 ug | INTRAMUSCULAR | Status: DC | PRN

## 2018-03-09 MED ORDER — ACETAMINOPHEN 10 MG/ML IV SOLN
INTRAVENOUS | Status: AC
Start: 2018-03-09 — End: ?
  Filled 2018-03-09: qty 100

## 2018-03-09 MED ORDER — KETOROLAC TROMETHAMINE 30 MG/ML IJ SOLN
INTRAMUSCULAR | Status: AC
  Filled 2018-03-09: qty 1

## 2018-03-09 MED ORDER — OXYCODONE-ACETAMINOPHEN 5-325 MG PO TABS: 1.0000 | ORAL_TABLET | Freq: Four times a day (QID) | ORAL | 0 refills | Status: AC | PRN

## 2018-03-09 MED ORDER — ACETAMINOPHEN 10 MG/ML IV SOLN
INTRAVENOUS | Status: DC | PRN
  Administered 2018-03-09: 1000 mg via INTRAVENOUS

## 2018-03-09 MED ORDER — ONDANSETRON HCL 4 MG/2ML IJ SOLN: 4.0000 mg | Freq: Once | INTRAMUSCULAR | Status: DC | PRN

## 2018-03-09 MED ORDER — MIDAZOLAM HCL 2 MG/2ML IJ SOLN
INTRAMUSCULAR | Status: AC
  Filled 2018-03-09: qty 2

## 2018-03-09 MED ORDER — ONDANSETRON HCL 4 MG/2ML IJ SOLN
INTRAMUSCULAR | Status: DC | PRN
  Administered 2018-03-09: 4 mg via INTRAVENOUS

## 2018-03-09 SURGICAL SUPPLY — 29 items
ADHESIVE MASTISOL STRL (MISCELLANEOUS) ×3 IMPLANT
CANISTER SUCT 1200ML W/VALVE (MISCELLANEOUS) ×3 IMPLANT
CHLORAPREP W/TINT 26ML (MISCELLANEOUS) ×3 IMPLANT
CLOSURE WOUND 1/2 X4 (GAUZE/BANDAGES/DRESSINGS) ×1
DRAPE LAPAROTOMY 100X77 ABD (DRAPES) ×3 IMPLANT
ELECT REM PT RETURN 9FT ADLT (ELECTROSURGICAL) ×3
ELECTRODE REM PT RTRN 9FT ADLT (ELECTROSURGICAL) ×1 IMPLANT
GLOVE BIO SURGEON STRL SZ8 (GLOVE) ×6 IMPLANT
GOWN STRL REUS W/ TWL LRG LVL3 (GOWN DISPOSABLE) ×2 IMPLANT
GOWN STRL REUS W/TWL LRG LVL3 (GOWN DISPOSABLE) ×4
LABEL OR SOLS (LABEL) ×3 IMPLANT
MESH VENTRALEX ST 1-7/10 CRC S (Mesh General) ×3 IMPLANT
NEEDLE HYPO 22GX1.5 SAFETY (NEEDLE) ×3 IMPLANT
NS IRRIG 500ML POUR BTL (IV SOLUTION) ×3 IMPLANT
PACK BASIN MINOR ARMC (MISCELLANEOUS) ×3 IMPLANT
SPONGE GAUZE 2X2 8PLY STER LF (GAUZE/BANDAGES/DRESSINGS) ×2
SPONGE GAUZE 2X2 8PLY STRL LF (GAUZE/BANDAGES/DRESSINGS) ×4 IMPLANT
SPONGE LAP 18X18 5 PK (GAUZE/BANDAGES/DRESSINGS) ×3 IMPLANT
STRIP CLOSURE SKIN 1/2X4 (GAUZE/BANDAGES/DRESSINGS) ×2 IMPLANT
SUT ETHIBOND 0 (SUTURE) ×3 IMPLANT
SUT ETHIBOND NAB CT1 #1 30IN (SUTURE) ×3 IMPLANT
SUT MNCRL 4-0 (SUTURE) ×2
SUT MNCRL 4-0 27XMFL (SUTURE) ×1
SUT PROLENE 0 CT 1 30 (SUTURE) ×12 IMPLANT
SUT PROLENE 1 CT 1 30 (SUTURE) ×12 IMPLANT
SUT VIC AB 3-0 SH 27 (SUTURE) ×2
SUT VIC AB 3-0 SH 27X BRD (SUTURE) ×1 IMPLANT
SUTURE MNCRL 4-0 27XMF (SUTURE) ×1 IMPLANT
SYR 10ML LL (SYRINGE) ×3 IMPLANT

## 2018-03-09 NOTE — Anesthesia Procedure Notes (Signed)
Procedure Name: LMA Insertion Performed by: Aryan Bello, CRNA Pre-anesthesia Checklist: Patient identified, Patient being monitored, Timeout performed, Emergency Drugs available and Suction available Patient Re-evaluated:Patient Re-evaluated prior to induction Oxygen Delivery Method: Circle system utilized Preoxygenation: Pre-oxygenation with 100% oxygen Induction Type: IV induction Ventilation: Mask ventilation without difficulty LMA: LMA inserted LMA Size: 4.5 Tube type: Oral Number of attempts: 1 Placement Confirmation: positive ETCO2 and breath sounds checked- equal and bilateral Tube secured with: Tape Dental Injury: Teeth and Oropharynx as per pre-operative assessment        

## 2018-03-09 NOTE — Anesthesia Postprocedure Evaluation (Signed)
Anesthesia Post Note  Patient: Stanley Gross  Procedure(s) Performed: HERNIA REPAIR UMBILICAL ADULT (N/A )  Patient location during evaluation: PACU Anesthesia Type: General Level of consciousness: awake and alert and oriented Pain management: pain level controlled Vital Signs Assessment: post-procedure vital signs reviewed and stable Respiratory status: spontaneous breathing Cardiovascular status: blood pressure returned to baseline Anesthetic complications: no     Last Vitals:  Vitals:   03/09/18 1425 03/09/18 1430  BP: 114/83   Pulse: 79   Resp: 19   Temp:    SpO2: 100% 98%    Last Pain:  Vitals:   03/09/18 1425  TempSrc:   PainSc: 0-No pain                 Latoia Eyster

## 2018-03-09 NOTE — OR Nursing (Signed)
Discharge instructions discussed with pt and family by interpreter. Both voice understanding.

## 2018-03-09 NOTE — Op Note (Signed)
Abdominal Hernia Repair  Pre-operative Diagnosis: UH  Post-operative Diagnosis: UH  Surgeon: Adah Salvageichard E. Excell Seltzerooper, MD FACS  Anesthesia: Gen. with endotracheal tube  Assistant:PA student  Repair of UH with mesh  Procedure Details  The patient was seen again in the Holding Room. The benefits, complications, treatment options, and expected outcomes were discussed with the patient. The risks of bleeding, infection, recurrence of symptoms, failure to resolve symptoms, bowel injury, mesh placement, mesh infection, any of which could require further surgery were reviewed with the patient. The likelihood of improving the patient's symptoms with return to their baseline status is good.  The patient and/or family concurred with the proposed plan, giving informed consent.  The patient was taken to Operating Room, identified as Stanley Gross and the procedure verified.  A Time Out was held and the above information confirmed.  Prior to the induction of general anesthesia, antibiotic prophylaxis was administered. VTE prophylaxis was in place. General endotracheal anesthesia was then administered and tolerated well. After the induction, the abdomen was prepped with Chloraprep and draped in the sterile fashion. The patient was positioned in the supine position.  Incision was made. UH disected. @cm  defect reduced, fascial edges cleaned. 4cm Ventralex patch, coated. Placed with closure of fascia with 0 ethibond. 30cc marcaine used.  Wound closed with deep sutures of 30 vicryl then 40 monocryl at skin edges.  Steristrips and mastisol,  Sterile dressings placed.  Findings:  UH  Estimated Blood Loss: Min         Drains:none         Specimens:none          Complications: none               Samani Deal E. Excell Seltzerooper, MD, FACS

## 2018-03-09 NOTE — Transfer of Care (Signed)
Immediate Anesthesia Transfer of Care Note  Patient: Stanley Gross  Procedure(s) Performed: HERNIA REPAIR UMBILICAL ADULT (N/A )  Patient Location: PACU  Anesthesia Type:General  Level of Consciousness: sedated  Airway & Oxygen Therapy: Patient Spontanous Breathing and Patient connected to face mask oxygen  Post-op Assessment: Report given to RN and Post -op Vital signs reviewed and stable  Post vital signs: Reviewed and stable  Last Vitals:  Vitals Value Taken Time  BP 110/79 03/09/2018  2:09 PM  Temp    Pulse 71 03/09/2018  2:10 PM  Resp 13 03/09/2018  2:10 PM  SpO2 99 % 03/09/2018  2:10 PM  Vitals shown include unvalidated device data.  Last Pain:  Vitals:   03/09/18 1234  TempSrc:   PainSc: 0-No pain         Complications: No apparent anesthesia complications

## 2018-03-09 NOTE — Anesthesia Post-op Follow-up Note (Signed)
Anesthesia QCDR form completed.        

## 2018-03-09 NOTE — Progress Notes (Signed)
Preoperative Review   Patient is met in the preoperative holding area. The history is reviewed in the chart and with the patient. I personally reviewed the options and rationale as well as the risks of this procedure that have been previously discussed with the patient. All questions asked by the patient and/or family were answered to their satisfaction.  Patient agrees to proceed with this procedure at this time.  Gildardo Tickner E Kendale Rembold M.D. FACS  

## 2018-03-09 NOTE — Anesthesia Preprocedure Evaluation (Addendum)
Anesthesia Evaluation  Patient identified by MRN, date of birth, ID band Patient awake    Reviewed: Allergy & Precautions, NPO status , Patient's Chart, lab work & pertinent test results  Airway Mallampati: III  TM Distance: >3 FB     Dental  (+) Chipped, Caps   Pulmonary neg pulmonary ROS,    Pulmonary exam normal        Cardiovascular negative cardio ROS Normal cardiovascular exam     Neuro/Psych negative neurological ROS  negative psych ROS   GI/Hepatic Neg liver ROS, Umbilical hernia   Endo/Other  negative endocrine ROS  Renal/GU stones  negative genitourinary   Musculoskeletal negative musculoskeletal ROS (+)   Abdominal Normal abdominal exam  (+)   Peds negative pediatric ROS (+)  Hematology negative hematology ROS (+)   Anesthesia Other Findings   Reproductive/Obstetrics                             Anesthesia Physical Anesthesia Plan  ASA: II  Anesthesia Plan: General   Post-op Pain Management:    Induction: Intravenous  PONV Risk Score and Plan:   Airway Management Planned: Oral ETT and LMA  Additional Equipment:   Intra-op Plan:   Post-operative Plan: Extubation in OR  Informed Consent: I have reviewed the patients History and Physical, chart, labs and discussed the procedure including the risks, benefits and alternatives for the proposed anesthesia with the patient or authorized representative who has indicated his/her understanding and acceptance.   Dental advisory given  Plan Discussed with: CRNA and Surgeon  Anesthesia Plan Comments:        Anesthesia Quick Evaluation

## 2018-03-09 NOTE — Discharge Instructions (Signed)
Remove dressing in 24 hours. °May shower in 24 hours. °Leave paper strips in place. °Resume all home medications. °Follow-up with Dr. Elfreida Heggs in 10 days. °

## 2018-03-10 ENCOUNTER — Encounter: Payer: Self-pay | Admitting: Surgery

## 2018-03-20 ENCOUNTER — Ambulatory Visit (INDEPENDENT_AMBULATORY_CARE_PROVIDER_SITE_OTHER): Payer: Self-pay | Admitting: Surgery

## 2018-03-20 DIAGNOSIS — K429 Umbilical hernia without obstruction or gangrene: Secondary | ICD-10-CM

## 2018-03-20 NOTE — Progress Notes (Signed)
Outpatient postop visit  03/20/2018  Stanley Gross is an 37 y.o. male.    Procedure: Umbilical hernia repair with mesh  CC: No problems  HPI: Status post repair of umbilical hernia with mesh.  Patient wants to go back to work tomorrow.  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  Medications reviewed.    Physical Exam:  There were no vitals taken for this visit.    PE: Wound is clean no erythema no drainage some of the Steri-Strips are still in place    Assessment/Plan:  Status post repair of an umbilical hernia with a small ventral X patch.  Patient doing very well recommend follow-up on an as-needed basis he is instructed to do no heavy lifting for 2 weeks.  Lattie Hawichard E Terrell Ostrand, MD, FACS

## 2018-03-20 NOTE — Patient Instructions (Signed)
Por favor llamenos si tiene alguna pregunta.  Por favor llene la aplicacion de financiamiento para ver si cualifica para ayuda de [facility].

## 2019-06-30 ENCOUNTER — Other Ambulatory Visit: Payer: Self-pay

## 2019-06-30 DIAGNOSIS — Z202 Contact with and (suspected) exposure to infections with a predominantly sexual mode of transmission: Secondary | ICD-10-CM | POA: Insufficient documentation

## 2019-06-30 DIAGNOSIS — N342 Other urethritis: Secondary | ICD-10-CM | POA: Insufficient documentation

## 2019-06-30 LAB — CBC
HCT: 45.9 % (ref 39.0–52.0)
Hemoglobin: 16.1 g/dL (ref 13.0–17.0)
MCH: 31.2 pg (ref 26.0–34.0)
MCHC: 35.1 g/dL (ref 30.0–36.0)
MCV: 89 fL (ref 80.0–100.0)
Platelets: 312 10*3/uL (ref 150–400)
RBC: 5.16 MIL/uL (ref 4.22–5.81)
RDW: 11.9 % (ref 11.5–15.5)
WBC: 6.8 10*3/uL (ref 4.0–10.5)
nRBC: 0 % (ref 0.0–0.2)

## 2019-06-30 LAB — URINALYSIS, COMPLETE (UACMP) WITH MICROSCOPIC
Bacteria, UA: NONE SEEN
Bilirubin Urine: NEGATIVE
Glucose, UA: NEGATIVE mg/dL
Hgb urine dipstick: NEGATIVE
Ketones, ur: NEGATIVE mg/dL
Nitrite: NEGATIVE
Protein, ur: NEGATIVE mg/dL
Specific Gravity, Urine: 1.024 (ref 1.005–1.030)
Squamous Epithelial / LPF: NONE SEEN (ref 0–5)
pH: 6 (ref 5.0–8.0)

## 2019-06-30 LAB — BASIC METABOLIC PANEL
Anion gap: 9 (ref 5–15)
BUN: 18 mg/dL (ref 6–20)
CO2: 24 mmol/L (ref 22–32)
Calcium: 8.8 mg/dL — ABNORMAL LOW (ref 8.9–10.3)
Chloride: 105 mmol/L (ref 98–111)
Creatinine, Ser: 1.11 mg/dL (ref 0.61–1.24)
GFR calc Af Amer: >60 mL/min (ref >60)
GFR calc non Af Amer: >60 mL/min (ref >60)
Glucose, Bld: 113 mg/dL — ABNORMAL HIGH (ref 70–99)
Potassium: 3.6 mmol/L (ref 3.5–5.1)
Sodium: 138 mmol/L (ref 135–145)

## 2019-06-30 NOTE — ED Triage Notes (Signed)
Patient c/o urinary retention and pelvic pain.

## 2019-07-01 ENCOUNTER — Emergency Department
Admission: EM | Admit: 2019-07-01 | Discharge: 2019-07-01 | Disposition: A | Discharge status: Home / Self Care | Payer: Self-pay | Attending: Emergency Medicine | Admitting: Emergency Medicine

## 2019-07-01 DIAGNOSIS — N342 Other urethritis: Secondary | ICD-10-CM

## 2019-07-01 DIAGNOSIS — A64 Unspecified sexually transmitted disease: Secondary | ICD-10-CM

## 2019-07-01 LAB — CHLAMYDIA/NGC RT PCR (ARMC ONLY): Chlamydia Tr: NOT DETECTED

## 2019-07-01 LAB — CHLAMYDIA/NGC RT PCR (ARMC ONLY)??????????: N gonorrhoeae: NOT DETECTED

## 2019-07-01 MED ORDER — AZITHROMYCIN 1 G PO PACK
1.0000 g | PACK | Freq: Once | ORAL | Status: AC
  Administered 2019-07-01: 1 g via ORAL
  Filled 2019-07-01: qty 1

## 2019-07-01 MED ORDER — CEFTRIAXONE SODIUM 250 MG IJ SOLR
250.0000 mg | Freq: Once | INTRAMUSCULAR | Status: AC
  Administered 2019-07-01: 05:00:00 250 mg via INTRAMUSCULAR
  Filled 2019-07-01: qty 250

## 2019-07-01 MED ORDER — IBUPROFEN 600 MG PO TABS
600.0000 mg | ORAL_TABLET | Freq: Once | ORAL | Status: AC
  Administered 2019-07-01: 600 mg via ORAL
  Filled 2019-07-01: qty 1

## 2019-07-01 MED ORDER — CEPHALEXIN 500 MG PO CAPS: 500.0000 mg | ORAL_CAPSULE | Freq: Three times a day (TID) | ORAL | 0 refills | Status: AC

## 2019-07-01 NOTE — ED Notes (Addendum)
[place] interpreter provided services. Pt reports 8 to 9 days of burning with urination.

## 2019-07-01 NOTE — Discharge Instructions (Addendum)
Take over-the-counter ibuprofen/motrin 600 mg every 8 hours for pain  Your symptoms should improve over the next 24 hours  You can also take over-the-counter AZO, for UTIs  You have been seen today in the Emergency Department (ED) for pelvic pain and discharge.  Your workup today shows that you had one or more sexually transmitted infections (STIs).  You have been treated with a one time dose medication for both of these conditions.  Please have your partner tested for STD's and do not resume sexual activity until you and your partner have confirmed negative test results or have both been treated.  Please follow up with your doctor as soon as possible regarding todays ED visit and your symptoms.   Return to the ED if your pain worsens, you develop a fever, or for any other symptoms that concern you.

## 2019-07-01 NOTE — ED Provider Notes (Addendum)
[facility] Emergency Department Provider Note  ____________________________________________   First MD Initiated Contact with Patient 07/01/19 [phone]     (approximate)  I have reviewed the triage vital signs and the nursing notes.   HISTORY  Chief Complaint Urinary Retention    HPI Stanley Gross is a 38 y.o. male here with urinary frequency and urgency.  The patient states that he had unprotected intercourse with a male approximately 10 days ago.  The next day, he developed a tingling sensation whenever he urinated.  Since then, has had persistently worsening burning and suprapubic/pelvic pain.  He states the pain seems somewhat persistent though is acutely worsened and feels like burning and stabbing whenever he pees.  He does not feel like he has had any decreased urinary output, but states he has been trying to prevent peeing due to the pain.  He said no flank pain.  No nausea or vomiting.  He does have history of kidney stones but states he has had [place] no flank pain or hematuria.  Denies known history of STDs.  No other complaints.        Past Medical History:  Diagnosis Date  . History of kidney stones    long time ago  . Umbilical hernia 02/2018   has had x 3 years    Patient Active Problem List   Diagnosis Date Noted  . Umbilical hernia without obstruction and without gangrene     Past Surgical History:  Procedure Laterality Date  . NO PAST SURGERIES    . UMBILICAL HERNIA REPAIR N/A 03/09/2018   Procedure: HERNIA REPAIR UMBILICAL ADULT;  Surgeon: Lattie Hawooper, Richard E, MD;  Location: [facility] ORS;  Service: General;  Laterality: N/A;    Prior to Admission medications   Medication Sig Start Date End Date Taking? Authorizing Provider  acetaminophen (TYLENOL) 500 MG tablet Take 1,000 mg by mouth every 6 (six) hours as needed (for pain.).    [provider]  cephALEXin (KEFLEX) 500 MG capsule Take 1 capsule (500 mg total) by  mouth 3 (three) times daily for 7 days. 07/01/19 07/08/19  Shaune PollackIsaacs, Gershom Brobeck, MD  Polyethyl Glycol-Propyl Glycol (LUBRICANT EYE DROPS) 0.4-0.3 % SOLN Place 1-2 drops into both eyes 3 (three) times daily as needed (for dry/irritated eyes).    [provider]    Allergies Patient has no known allergies.  Family History  Problem Relation Age of Onset  . Healthy Mother   . Prostate cancer Father     Social History Social History   Tobacco Use  . Smoking status: Never Smoker  . Smokeless tobacco: Never Used  Substance Use Topics  . Alcohol use: Yes    Frequency: Never    Comment: a beer daily  . Drug use: No    Review of Systems  Review of Systems  Constitutional: Negative for chills, fatigue and fever.  HENT: Negative for sore throat.   Respiratory: Negative for shortness of breath.   Cardiovascular: Negative for chest pain.  Gastrointestinal: Negative for abdominal pain.  Genitourinary: Positive for difficulty urinating, dysuria and penile pain. Negative for flank pain.  Musculoskeletal: Negative for neck pain.  Skin: Negative for rash and wound.  Allergic/Immunologic: Negative for immunocompromised state.  Neurological: Negative for weakness and numbness.  Hematological: Does not bruise/bleed easily.  All other systems reviewed and are negative.    ____________________________________________  PHYSICAL EXAM:      VITAL SIGNS: ED Triage Vitals  Enc Vitals Group  BP 06/30/19 2222 (!) 124/91     Pulse Rate 06/30/19 2222 96     Resp 06/30/19 2222 18     Temp 06/30/19 2222 98.8 F (37.1 C)     Temp Source 06/30/19 2222 Oral     SpO2 06/30/19 2222 98 %     Weight 06/30/19 2223 180 lb (81.6 kg)     Height 06/30/19 2223 5' 4.96" (1.65 m)     Head Circumference --      Peak Flow --      Pain Score 06/30/19 2222 5     Pain Loc --      Pain Edu? --      Excl. in GC? --      Physical Exam Vitals signs and nursing note reviewed. Exam conducted with a  chaperone present.  Constitutional:      General: He is not in acute distress.    Appearance: He is well-developed.  HENT:     Head: Normocephalic and atraumatic.  Eyes:     Conjunctiva/sclera: Conjunctivae normal.  Neck:     Musculoskeletal: Neck supple.  Cardiovascular:     Rate and Rhythm: Normal rate and regular rhythm.     Heart sounds: Normal heart sounds. No murmur. No friction rub.  Pulmonary:     Effort: Pulmonary effort is normal. No respiratory distress.     Breath sounds: Normal breath sounds. No wheezing or rales.  Abdominal:     General: There is no distension.     Palpations: Abdomen is soft.     Tenderness: There is no abdominal tenderness.  Genitourinary:    Penis: Uncircumcised.      Scrotum/Testes:        Right: Tenderness or swelling not present. Cremasteric reflex is present.         Left: Tenderness or swelling not present. Cremasteric reflex is present.   Skin:    General: Skin is warm.     Capillary Refill: Capillary refill takes less than 2 seconds.  Neurological:     Mental Status: He is alert and oriented to person, place, and time.     Motor: No abnormal muscle tone.       ____________________________________________   LABS (all labs ordered are listed, but only abnormal results are displayed)  Labs Reviewed  URINALYSIS, COMPLETE (UACMP) WITH MICROSCOPIC - Abnormal; Notable for the following components:      Result Value   Color, Urine YELLOW (*)    APPearance CLEAR (*)    Leukocytes,Ua TRACE (*)    All other components within normal limits  BASIC METABOLIC PANEL - Abnormal; Notable for the following components:   Glucose, Bld 113 (*)    Calcium 8.8 (*)    All other components within normal limits  CHLAMYDIA/NGC RT PCR ([facility] ONLY)  URINE CULTURE  CBC  HIV ANTIBODY (ROUTINE TESTING W REFLEX)    ____________________________________________  EKG: None ________________________________________  RADIOLOGY All imaging,  including plain films, CT scans, and ultrasounds, independently reviewed by me, and interpretations confirmed via formal radiology reads.  ED MD interpretation:   NOne  Official radiology report(s): No results found.  ____________________________________________  PROCEDURES   Procedure(s) performed (including Critical Care):  Procedures  ____________________________________________  INITIAL IMPRESSION / MDM / ASSESSMENT AND PLAN / ED COURSE  As part of my medical decision making, I reviewed the following data within the electronic MEDICAL RECORD NUMBER Notes from prior ED visits and [place] Controlled Substance Database      *  Stanley Gross was evaluated in Emergency Department on 07/01/2019 for the symptoms described in the history of present illness. He was evaluated in the context of the global COVID-19 pandemic, which necessitated consideration that the patient might be at risk for infection with the SARS-CoV-2 virus that causes COVID-19. Institutional protocols and algorithms that pertain to the evaluation of patients at risk for COVID-19 are in a state of rapid change based on information released by regulatory bodies including the CDC and federal and state organizations. These policies and algorithms were followed during the patient's care in the ED.  Some ED evaluations and interventions may be delayed as a result of limited staffing during the pandemic.*   Clinical Course as of Jul 01 919  Sun Jul 01, 2019  0618 Mucus: PRESENT [CI]    Clinical Course User Index [CI] Duffy Bruce, MD    Medical Decision Making: 38 yo M here with suspected STI. Unlikely UTI given age, known high risk sexual activity, no h/o prostate issues or prior UTIs. Pt treated here. GC/C and HIV sent. No sx to suggest concomitant stone. Safe sex precautions given.  ____________________________________________  FINAL CLINICAL IMPRESSION(S) / ED DIAGNOSES  Final diagnoses:  Urethritis  STD  (sexually transmitted disease)     MEDICATIONS GIVEN DURING THIS VISIT:  Medications  cefTRIAXone (ROCEPHIN) injection 250 mg (250 mg Intramuscular Given 07/01/19 0518)  azithromycin (ZITHROMAX) powder 1 g (1 g Oral Given 07/01/19 0503)  ibuprofen (ADVIL) tablet 600 mg (600 mg Oral Given 07/01/19 0503)     ED Discharge Orders         Ordered    cephALEXin (KEFLEX) 500 MG capsule  3 times daily     07/01/19 5993           Note:  This document was prepared using Dragon voice recognition software and may include unintentional dictation errors.   Duffy Bruce, MD 07/01/19 5701    Duffy Bruce, MD 07/01/19 [phone]

## 2019-07-02 LAB — URINE CULTURE
Culture: NO GROWTH
Special Requests: NORMAL

## 2019-07-03 LAB — HIV ANTIBODY (ROUTINE TESTING W REFLEX): HIV Screen 4th Generation wRfx: NONREACTIVE

## 2019-07-05 ENCOUNTER — Encounter: Payer: Self-pay | Admitting: Physician Assistant

## 2019-07-05 ENCOUNTER — Ambulatory Visit: Payer: Self-pay | Admitting: Physician Assistant

## 2019-07-05 ENCOUNTER — Other Ambulatory Visit: Payer: Self-pay

## 2019-07-05 DIAGNOSIS — N342 Other urethritis: Secondary | ICD-10-CM

## 2019-07-05 DIAGNOSIS — Z113 Encounter for screening for infections with a predominantly sexual mode of transmission: Secondary | ICD-10-CM

## 2019-07-05 NOTE — Progress Notes (Signed)
Client presents to STD clinic reporting the following: to [facility] ED 07/01/2019 where diagnosed with urethritis (HIV - non reactive, urine gonorrhea and chlamydia negative). Client states had an injection and was told if urinary symptoms did not improve, he needed to see MD again. Per Epic, Cephalexin (Keflex) prescribed. Client states unaware he needed to pick up a prescription. Phone call to [facility] on [address] and per [employer], prescription is ready and will cost $10. 37. A. Streilein PA-C notified of above. STI clinic consent signed. Shona Needles, RN

## 2019-07-05 NOTE — Progress Notes (Signed)
Via language line/Spanish interpreter, counseled pt that he should pick up Keflex Rx at the [facility] and take one pill 3 times a day for 7 days as per ED instructions. He states he has transportation to the pharmacy and can afford to purchase the med. I counseled him to return to the clinic if his symptoms persist or worsen after completion of the antibiotic.

## 2019-07-10 ENCOUNTER — Ambulatory Visit: Payer: Self-pay

## 2020-03-02 ENCOUNTER — Ambulatory Visit: Payer: Self-pay

## 2024-04-04 ENCOUNTER — Other Ambulatory Visit: Payer: Self-pay

## 2024-04-04 ENCOUNTER — Emergency Department
Admission: EM | Admit: 2024-04-04 | Discharge: 2024-04-04 | Disposition: A | Discharge status: Home / Self Care | Payer: Self-pay

## 2024-04-04 DIAGNOSIS — K644 Residual hemorrhoidal skin tags: Secondary | ICD-10-CM | POA: Insufficient documentation

## 2024-04-04 DIAGNOSIS — K6289 Other specified diseases of anus and rectum: Secondary | ICD-10-CM

## 2024-04-04 DIAGNOSIS — K649 Unspecified hemorrhoids: Secondary | ICD-10-CM

## 2024-04-04 LAB — URINALYSIS, COMPLETE (UACMP) WITH MICROSCOPIC
Bacteria, UA: NONE SEEN
Bilirubin Urine: NEGATIVE
Glucose, UA: NEGATIVE mg/dL
Ketones, ur: NEGATIVE mg/dL
Leukocytes,Ua: NEGATIVE
Nitrite: NEGATIVE
Protein, ur: NEGATIVE mg/dL
Specific Gravity, Urine: 1.016 (ref 1.005–1.030)
Squamous Epithelial / HPF: 0 /HPF (ref 0–5)
pH: 7 (ref 5.0–8.0)

## 2024-04-04 MED ORDER — PHENYLEPHRINE IN HARD FAT 0.25 % RE SUPP: 1.0000 | Freq: Two times a day (BID) | RECTAL | 0 refills | Status: AC

## 2024-04-04 MED ORDER — DOCUSATE SODIUM 100 MG PO CAPS: 100.0000 mg | ORAL_CAPSULE | Freq: Two times a day (BID) | ORAL | 0 refills | Status: AC

## 2024-04-04 MED ORDER — KETOROLAC TROMETHAMINE 30 MG/ML IJ SOLN
30.0000 mg | Freq: Once | INTRAMUSCULAR | Status: AC
  Administered 2024-04-04: 30 mg via INTRAMUSCULAR
  Filled 2024-04-04: qty 1

## 2024-04-04 MED ORDER — ACETAMINOPHEN 500 MG PO TABS: 1000.0000 mg | ORAL_TABLET | Freq: Four times a day (QID) | ORAL | 2 refills | Status: AC | PRN

## 2024-04-04 MED ORDER — ACETAMINOPHEN 500 MG PO TABS
1000.0000 mg | ORAL_TABLET | Freq: Once | ORAL | Status: AC
  Administered 2024-04-04: 1000 mg via ORAL
  Filled 2024-04-04: qty 2

## 2024-04-04 NOTE — Discharge Instructions (Addendum)
 Your evaluation in the emergency department was notable for hemorrhoids.  I recommend sitting in a warm bath of water for about the 15 minutes 3 times a day and avoiding straining while using the bathroom.  I prescribed you several medications to help with your discomfort, I recommend you follow-up with primary care doctor for reevaluation.  Return to the emergency department with any new or worsening symptoms.  //  Su evaluacin en urgencias fue importante por hemorroides. Le recomiendo tomar un bao con agua tibia durante unos 15 minutos, 3 veces al da, y Surveyor, quantity fuerza al ir al bao. Le recet varios medicamentos para aliviar sus molestias. Le recomiendo que consulte con su mdico de cabecera para una reevaluacin. Regrese a urgencias ante cualquier sntoma nuevo o que empeore.

## 2024-04-04 NOTE — ED Triage Notes (Signed)
 Patient C/O constipation that began two days ago. He also states that his rectum burns when he attempts to have a bowel movement. He states that his last normal bowel movement was on Sunday. Patient denies any nausea or vomiting at this time.

## 2024-04-04 NOTE — ED Provider Notes (Signed)
 [facility] Provider Note    None    (approximate)   History   Constipation  Patient C/O constipation that began two days ago. He also states that his rectum burns when he attempts to have a bowel movement. He states that his last normal bowel movement was on Sunday. Patient denies any nausea or vomiting at this time.    HPI Stanley Gross is a 43 y.o. male umbilical hernia status post repair (2019), prior urolithiasis presents for evaluation of rectal discomfort - Patient states he drink a fair bit of alcohol Saturday evening, felt okay Sunday, but then on Monday began developing some rectal pain with defecation - Last bowel movement yesterday, chronically has soft stools - Felt more discomfort with pooping today and noticed a small bit of blood on his toilet paper so decided to come to emergency department for eval - Believes he may have a history of prostatitis in the past - No trauma to rectal area - No urinary symptoms       Physical Exam   Triage Vital Signs: ED Triage Vitals  Encounter Vitals Group     BP 04/04/24 0633 (!) 147/105     Systolic BP Percentile --      Diastolic BP Percentile --      Pulse Rate 04/04/24 0633 85     Resp 04/04/24 0633 18     Temp 04/04/24 0633 97.6 F (36.4 C)     Temp Source 04/04/24 0633 Oral     SpO2 04/04/24 0633 97 %     Weight 04/04/24 0634 180 lb (81.6 kg)     Height 04/04/24 0634 5\' 4"  (1.626 m)     Head Circumference --      Peak Flow --      Pain Score 04/04/24 0634 0     Pain Loc --      Pain Education --      Exclude from Growth Chart --     Most recent vital signs: Vitals:   04/04/24 0633  BP: (!) 147/105  Pulse: 85  Resp: 18  Temp: 97.6 F (36.4 C)  SpO2: 97%     General: Awake, no distress.  CV:  Good peripheral perfusion Resp:  Normal effort. CTAB Abd:  No distention. Nontender to deep palpation throughout Rectal:  Small external hemorrhoids present, no obvious  anal fissure.  Patient only tolerated partial rectal exam, stopped at patient request.  No obvious masses or prostate bogginess appreciated though again a limited exam.  No blood.    ED Results / Procedures / Treatments   Labs (all labs ordered are listed, but only abnormal results are displayed) Labs Reviewed  URINALYSIS, COMPLETE (UACMP) WITH MICROSCOPIC - Abnormal; Notable for the following components:      Result Value   Color, Urine YELLOW (*)    APPearance CLEAR (*)    Hgb urine dipstick SMALL (*)    All other components within normal limits     EKG  N/a   RADIOLOGY N/a    PROCEDURES:  Critical Care performed: No  Procedures   MEDICATIONS ORDERED IN ED: Medications  ketorolac  (TORADOL ) 30 MG/ML injection 30 mg (30 mg Intramuscular Given 04/04/24 0750)  acetaminophen  (TYLENOL ) tablet 1,000 mg (1,000 mg Oral Given 04/04/24 0751)     IMPRESSION / MDM / ASSESSMENT AND PLAN / ED COURSE  I reviewed the triage vital signs and the nursing notes.  DDX/MDM/AP: Differential diagnosis includes, but is not limited to, external hemorrhoids, no obvious anal fissure appreciated on initial exam, consider prostatitis, doubt mass.  No concern for bowel obstruction.   Plan: - Urinalysis to screen for prostatitis - Pain medications to facilitate repeat attempt at rectal exam  Patient's presentation is most consistent with acute complicated illness / injury requiring diagnostic workup.   ED course below.  Urinalysis unremarkable, less consistent with prostatitis.  Patient declined repeat rectal exam though does have obvious hemorrhoids on initial exam, suspect this is likely etiology of presentation.  Started on Preparation H, recommend sitz bath's, Tylenol , Colace--prescriptions written.  Strict ED return precautions in place.  Plan for PMD follow-up.  Counseled to avoid spicy foods and alcohol as well and to avoid straining while using the  bathroom.  Clinical Course as of 04/04/24 9147  Wed Apr 04, 2024  0806 UA with no pyuria, less consistent with prostatitis. [MM]    Clinical Course User Index [MM] Collis Deaner, MD     FINAL CLINICAL IMPRESSION(S) / ED DIAGNOSES   Final diagnoses:  Hemorrhoids, unspecified hemorrhoid type  Rectal pain     Rx / DC Orders   ED Discharge Orders          Ordered    phenylephrine (,USE FOR PREPARATION-H,) 0.25 % suppository  2 times daily        04/04/24 0748    acetaminophen  (TYLENOL ) 500 MG tablet  Every 6 hours PRN        04/04/24 0748    docusate sodium (COLACE) 100 MG capsule  2 times daily        04/04/24 8295             Note:  This document was prepared using Dragon voice recognition software and may include unintentional dictation errors.   Collis Deaner, MD 04/04/24 [phone]
